# Patient Record
Sex: Male | Born: 1995 | Race: Asian | Hispanic: No | Marital: Single | Smoking: Never smoker
Health system: Southern US, Community
[De-identification: ages and names within clinical notes are randomized; demographics above are authoritative.]

## PROBLEM LIST (undated history)

## (undated) DIAGNOSIS — Z789 Other specified health status: Secondary | ICD-10-CM

## (undated) DIAGNOSIS — E785 Hyperlipidemia, unspecified: Secondary | ICD-10-CM

## (undated) DIAGNOSIS — G709 Myoneural disorder, unspecified: Secondary | ICD-10-CM

## (undated) HISTORY — PX: NO PAST SURGERIES: SHX2092

## (undated) HISTORY — DX: Other specified health status: Z78.9

## (undated) HISTORY — DX: Myoneural disorder, unspecified: G70.9

## (undated) HISTORY — DX: Hyperlipidemia, unspecified: E78.5

---
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
## 2013-06-10 ENCOUNTER — Ambulatory Visit (INDEPENDENT_AMBULATORY_CARE_PROVIDER_SITE_OTHER): Payer: BC Managed Care – PPO | Admitting: Emergency Medicine

## 2013-06-10 VITALS — BP 136/78 | HR 65 | Temp 98.5°F | Resp 18 | Wt 166.0 lb

## 2013-06-10 DIAGNOSIS — H6121 Impacted cerumen, right ear: Secondary | ICD-10-CM

## 2013-06-10 DIAGNOSIS — H6120 Impacted cerumen, unspecified ear: Secondary | ICD-10-CM

## 2013-06-10 NOTE — Progress Notes (Signed)
   Subjective:    Patient ID: Trevor Bates, male    DOB: 1996-04-25, 18 y.o.   MRN: [id number]  HPI patient presents with wax impaction in his right ear. He used to scoop without success and is here for irrigation.   Review of Systems     Objective:   Physical Exam There is wax occluding both external auditory canals appear       Assessment & Plan:   patient did undergo irrigation to remove the wax. Wax removed by irrigation .

## 2013-09-06 ENCOUNTER — Ambulatory Visit (INDEPENDENT_AMBULATORY_CARE_PROVIDER_SITE_OTHER): Payer: BC Managed Care – PPO | Admitting: Family Medicine

## 2013-09-06 ENCOUNTER — Encounter: Payer: Self-pay | Admitting: Family Medicine

## 2013-09-06 VITALS — BP 114/50 | HR 74 | Temp 98.4°F | Resp 16 | Ht 66.0 in | Wt 164.0 lb

## 2013-09-06 DIAGNOSIS — J309 Allergic rhinitis, unspecified: Secondary | ICD-10-CM

## 2013-09-06 DIAGNOSIS — J3489 Other specified disorders of nose and nasal sinuses: Secondary | ICD-10-CM

## 2013-09-06 DIAGNOSIS — R0981 Nasal congestion: Secondary | ICD-10-CM

## 2013-09-06 DIAGNOSIS — R0609 Other forms of dyspnea: Secondary | ICD-10-CM

## 2013-09-06 DIAGNOSIS — F411 Generalized anxiety disorder: Secondary | ICD-10-CM

## 2013-09-06 DIAGNOSIS — G4700 Insomnia, unspecified: Secondary | ICD-10-CM

## 2013-09-06 DIAGNOSIS — R0683 Snoring: Secondary | ICD-10-CM

## 2013-09-06 DIAGNOSIS — R0989 Other specified symptoms and signs involving the circulatory and respiratory systems: Secondary | ICD-10-CM

## 2013-09-06 MED ORDER — CLONAZEPAM 0.5 MG PO TABS: ORAL_TABLET | ORAL | Status: DC

## 2013-09-06 MED ORDER — TRIAMCINOLONE ACETONIDE 55 MCG/ACT NA AERO: 2.0000 | INHALATION_SPRAY | Freq: Every day | NASAL | Status: DC

## 2013-09-06 MED ORDER — ESCITALOPRAM OXALATE 20 MG PO TABS: 20.0000 mg | ORAL_TABLET | Freq: Every day | ORAL | Status: DC

## 2013-09-06 MED ORDER — DESLORATADINE 5 MG PO TABS: 5.0000 mg | ORAL_TABLET | Freq: Every day | ORAL | Status: DC

## 2013-09-06 NOTE — Patient Instructions (Addendum)
Generalized Anxiety Disorder Generalized anxiety disorder (GAD) is a mental disorder. It interferes with life functions, including relationships, work, and school. GAD is different from normal anxiety, which everyone experiences at some point in their lives in response to specific life events and activities. Normal anxiety actually helps [place] prepare for and get through these life events and activities. Normal anxiety goes away after the event or activity is over.  GAD causes anxiety that is not necessarily related to specific events or activities. It also causes excess anxiety in proportion to specific events or activities. The anxiety associated with GAD is also difficult to control. GAD can vary from mild to severe. People with severe GAD can have intense waves of anxiety with physical symptoms (panic attacks).  SYMPTOMS The anxiety and worry associated with GAD are difficult to control. This anxiety and worry are related to many life events and activities and also occur more days than not for 6 months or longer. People with GAD also have three or more of the following symptoms (one or more in children):  Restlessness.   Fatigue.  Difficulty concentrating.   Irritability.  Muscle tension.  Difficulty sleeping or unsatisfying sleep. DIAGNOSIS GAD is diagnosed through an assessment by your caregiver. Your caregiver will ask you questions aboutyour mood,physical symptoms, and events in your life. Your caregiver may ask you about your medical history and use of alcohol or drugs, including prescription medications. Your caregiver may also do a physical exam and blood tests. Certain medical conditions and the use of certain substances can cause symptoms similar to those associated with GAD. Your caregiver may refer you to a mental health specialist for further evaluation. TREATMENT The following therapies are usually used to treat GAD:   Medication Antidepressant medication usually is  prescribed for long-term daily control. Antianxiety medications may be added in severe cases, especially when panic attacks occur.   Talk therapy (psychotherapy) Certain types of talk therapy can be helpful in treating GAD by providing support, education, and guidance. A form of talk therapy called cognitive behavioral therapy can teach you healthy ways to think about and react to daily life events and activities.  Stress managementtechniques These include yoga, meditation, and exercise and can be very helpful when they are practiced regularly. A mental health specialist can help determine which treatment is best for you. Some people see improvement with one therapy. However, other people require a combination of therapies. Document Released: 09/10/2012 Document Reviewed: 09/10/2012 [employer] Patient Information    I have prescribed an anti-anxiety medication for you to take at bedtime. This medication should help you relax and enable you to sleep better.\ You also have to improve your nutrition; avoid processed foods and foods that have a lot of additives. I have given you some information about anti-inflammatory foods.  Taking a Omega-3 Fish Oil supplement can help with brain(and cardiovascular) health.    Allergic Rhinitis Allergic rhinitis is when the mucous membranes in the nose respond to allergens. Allergens are particles in the air that cause your body to have an allergic reaction. This causes you to release allergic antibodies. Through a chain of events, these eventually cause you to release histamine into the blood stream. Although meant to protect the body, it is this release of histamine that causes your discomfort, such as frequent sneezing, congestion, and an itchy, runny nose.  CAUSES  Seasonal allergic rhinitis (hay fever) is caused by pollen allergens that may come from grasses, trees, and weeds. Year-round allergic rhinitis (perennial allergic  rhinitis) is caused by allergens  such as house dust mites, pet dander, and mold spores.  SYMPTOMS   Nasal stuffiness (congestion).  Itchy, runny nose with sneezing and tearing of the eyes. DIAGNOSIS  Your health care provider can help you determine the allergen or allergens that trigger your symptoms. If you and your health care provider are unable to determine the allergen, skin or blood testing may be used. TREATMENT  Allergic Rhinitis does not have a cure, but it can be controlled by:  Medicines and allergy shots (immunotherapy).  Avoiding the allergen. Hay fever may often be treated with antihistamines in pill or nasal spray forms. Antihistamines block the effects of histamine. There are over-the-counter medicines that may help with nasal congestion and swelling around the eyes. Check with your health care provider before taking or giving this medicine.  If avoiding the allergen or the medicine prescribed do not work, there are many new medicines your health care provider can prescribe. Stronger medicine may be used if initial measures are ineffective. Desensitizing injections can be used if medicine and avoidance does not work. Desensitization is when a patient is given ongoing shots until the body becomes less sensitive to the allergen. Make sure you follow up with your health care provider if problems continue. HOME CARE INSTRUCTIONS It is not possible to completely avoid allergens, but you can reduce your symptoms by taking steps to limit your exposure to them. It helps to know exactly what you are allergic to so that you can avoid your specific triggers. SEEK MEDICAL CARE IF:   You have a fever.  You develop a cough that does not stop easily (persistent).  You have shortness of breath.  You start wheezing.  Symptoms interfere with normal daily activities. Document Released: 02/08/2001 Document Revised: 03/06/2013 Document Reviewed: 01/21/2013 [facility] Patient Information 2014 [place], [place].  Use the nasal  spray every night. Take the allergy medication to help reduce nasal congestion. You need to wear a mask when you are mowing the lawn or doing any work outside where you are exposed to lots of pollen. I may need to refer you to an Ear, Nose and Throat specialist for full evaluation if the medications are not eefective.  Get AYR over-the-counter saline nasal mist to help relieve nasal congestion. You can use this product during the day.

## 2013-09-06 NOTE — Progress Notes (Signed)
Subjective:    Patient ID: Trevor Bates, male    DOB: [birth year], 18 y.o.   MRN: [id number]  HPI  This 18 y.o. Asian male presents for evaluation and treatment of insomnia, onset > 1 year ago. Pt graduated high school in spring 2014 and attended fall semester at [employer]. He had some difficulty adjusting and had worsening sleep disturbance. He sought evaluation and was advised that he might be "Bipolar" and that he needed to seek further treatment. He is now enrolled [facility] [employer]. He is considering Biology major but not certain what he would like to do upon graduation.  Pt is able to perform academically, having taken AP courses in his senior year of high school. GPA=3.0; pt reports that he found he could do the work w/o really studying. He did not feel really challenged in high school. He reports often daydreaming. He does not sleep well, often laying awake until 2-3 AM. He admits trying alcohol and marijuana; the latter helps him relax so that he can sleep. He states his mind "will not shut down" and he functions on < 5 hours of sleep. He acknowledges that his nutrition if fair; he drinks coffee but no other significant sources of caffeine. Routine exercise (used to play tennis) but not excessive.  Pt states that he snores loudly; he cannot breath through his nose since childhood. This is a chronic problem; never evaluated by ENT. Known to have allergies. Not using any medication to reduce symptoms.  Family Hx is significant for anxiety but no significant mental illnesses.   Review of Systems  Constitutional: Negative.   HENT: Positive for congestion, rhinorrhea and sinus pressure. Negative for ear pain, facial swelling, postnasal drip, sneezing, sore throat and voice change.   Eyes: Negative.   Respiratory: Negative for cough, chest tightness, shortness of breath and wheezing.        Snores.  Cardiovascular: Negative.   Endocrine: Negative.   Genitourinary: Negative.   Musculoskeletal:  Negative.   Skin: Negative.   Allergic/Immunologic: Positive for environmental allergies.  Neurological: Negative.   Hematological: Negative.   Psychiatric/Behavioral: Positive for sleep disturbance and decreased concentration. Negative for hallucinations, behavioral problems, confusion, self-injury and agitation. The patient is nervous/anxious. The patient is not hyperactive.        Objective:   Physical Exam  Nursing note and vitals reviewed. Constitutional: He is oriented to person, place, and time. Vital signs are normal. He appears well-developed and well-nourished. No distress.  HENT:  Head: Normocephalic and atraumatic.  Right Ear: Hearing, tympanic membrane and external ear normal.  Left Ear: Hearing, tympanic membrane and external ear normal.  Nose: Mucosal edema present. No rhinorrhea, sinus tenderness, nasal deformity or septal deviation. No epistaxis. Right sinus exhibits no maxillary sinus tenderness and no frontal sinus tenderness. Left sinus exhibits no maxillary sinus tenderness and no frontal sinus tenderness.  Mouth/Throat: Uvula is midline and mucous membranes are normal. No oral lesions. Normal dentition. No uvula swelling or dental caries. Oropharyngeal exudate and posterior oropharyngeal erythema present.  Excessive flaky debris in canals. Tonsils 2+.  Eyes: EOM and lids are normal. Pupils are equal, round, and reactive to light. Right conjunctiva is injected. Left conjunctiva is injected. No scleral icterus.  Neck: Trachea normal, normal range of motion and full passive range of motion without pain. Neck supple. No spinous process tenderness and no muscular tenderness present. No mass and no thyromegaly present.  Cardiovascular: Normal rate, regular rhythm, S1 normal, S2 normal and normal heart  sounds.   No extrasystoles are present. PMI is not displaced.  Exam reveals no gallop and no friction rub.   No murmur heard. Pulmonary/Chest: Effort normal and breath sounds  normal. No respiratory distress.  Abdominal: Soft. Normal appearance and bowel sounds are normal. He exhibits no mass. There is no hepatosplenomegaly. There is no tenderness. There is no guarding and no CVA tenderness.  Musculoskeletal: Normal range of motion. He exhibits tenderness. He exhibits no edema.  Neurological: He is alert and oriented to person, place, and time. He has normal strength. He displays no atrophy and no tremor. No cranial nerve deficit or sensory deficit. He exhibits normal muscle tone. He displays a negative Romberg sign. Coordination and gait normal.  Reflex Scores:      Tricep reflexes are 1+ on the right side and 1+ on the left side.      Bicep reflexes are 1+ on the right side and 1+ on the left side.      Brachioradialis reflexes are 1+ on the right side and 1+ on the left side.      Patellar reflexes are 2+ on the right side and 2+ on the left side. Skin: Skin is warm, dry and intact. No bruising and no lesion noted. No cyanosis or erythema. No pallor. Nails show no clubbing.  Psychiatric: His speech is normal and behavior is normal. Judgment and thought content normal. His mood appears anxious. His affect is not angry, not labile and not inappropriate. Cognition and memory are normal. He does not exhibit a depressed mood.      Assessment & Plan:  Anxiety state, unspecified - Trial Clonazepam at bedtime. Will have pt complete mood disorder slef-questionnaire at follow-up. Plan: Thyroid Panel With TSH, Comprehensive metabolic panel  Insomnia - Plan: Vitamin D, 25-hydroxy, Thyroid Panel With TSH  Snores- Consider ENT evaluation if treatment of allergies does not reduce symptom.  Chronic nasal congestion  Allergic rhinitis - Plan: CBC with Differential  Meds ordered this encounter  Medications  . DISCONTD: triamcinolone (NASACORT AQ) 55 MCG/ACT AERO nasal inhaler    Sig: Place 2 sprays into the nose daily.    Dispense:  1 Inhaler    Refill:  12  .  triamcinolone (NASACORT AQ) 55 MCG/ACT AERO nasal inhaler    Sig: Place 2 sprays into the nose daily. Use at bedtime.    Dispense:  1 Inhaler    Refill:  12  . DISCONTD: escitalopram (LEXAPRO) 20 MG tablet    Sig: Take 1 tablet (20 mg total) by mouth daily.    Dispense:  30 tablet    Refill:  3  . clonazePAM (KLONOPIN) 0.5 MG tablet    Sig: Take 1 or 2 tablets at bedtime for anxiety and for sleep.    Dispense:  30 tablet    Refill:  1  . desloratadine (CLARINEX) 5 MG tablet    Sig: Take 1 tablet (5 mg total) by mouth daily.    Dispense:  30 tablet    Refill:  5

## 2013-09-07 LAB — COMPREHENSIVE METABOLIC PANEL
ALBUMIN: 4.5 g/dL (ref 3.5–5.2)
ALT: 31 U/L (ref 0–53)
AST: 62 U/L — ABNORMAL HIGH (ref 0–37)
Alkaline Phosphatase: 60 U/L (ref 39–117)
BUN: 16 mg/dL (ref 6–23)
CO2: 26 meq/L (ref 19–32)
CREATININE: 1.05 mg/dL (ref 0.50–1.35)
Calcium: 9.9 mg/dL (ref 8.4–10.5)
Chloride: 100 mEq/L (ref 96–112)
GLUCOSE: 83 mg/dL (ref 70–99)
POTASSIUM: 4.3 meq/L (ref 3.5–5.3)
Sodium: 137 mEq/L (ref 135–145)
Total Bilirubin: 0.6 mg/dL (ref 0.2–1.1)
Total Protein: 7 g/dL (ref 6.0–8.3)

## 2013-09-07 LAB — CBC WITH DIFFERENTIAL/PLATELET
BASOS ABS: 0 10*3/uL (ref 0.0–0.1)
BASOS PCT: 0 % (ref 0–1)
EOS ABS: 0.1 10*3/uL (ref 0.0–0.7)
EOS PCT: 1 % (ref 0–5)
HCT: 44.1 % (ref 39.0–52.0)
Hemoglobin: 15.1 g/dL (ref 13.0–17.0)
LYMPHS ABS: 2.2 10*3/uL (ref 0.7–4.0)
Lymphocytes Relative: 19 % (ref 12–46)
MCH: 29.6 pg (ref 26.0–34.0)
MCHC: 34.2 g/dL (ref 30.0–36.0)
MCV: 86.5 fL (ref 78.0–100.0)
Monocytes Absolute: 0.8 10*3/uL (ref 0.1–1.0)
Monocytes Relative: 7 % (ref 3–12)
NEUTROS PCT: 73 % (ref 43–77)
Neutro Abs: 8.5 10*3/uL — ABNORMAL HIGH (ref 1.7–7.7)
PLATELETS: 291 10*3/uL (ref 150–400)
RBC: 5.1 MIL/uL (ref 4.22–5.81)
RDW: 13.2 % (ref 11.5–15.5)
WBC: 11.6 10*3/uL — ABNORMAL HIGH (ref 4.0–10.5)

## 2013-09-07 LAB — VITAMIN D 25 HYDROXY (VIT D DEFICIENCY, FRACTURES): VIT D 25 HYDROXY: 13 ng/mL — AB (ref 30–89)

## 2013-09-07 LAB — THYROID PANEL WITH TSH
FREE THYROXINE INDEX: 3.9 (ref 1.0–3.9)
T3 Uptake: 36.1 % (ref 22.5–37.0)
T4, Total: 10.7 ug/dL (ref 5.0–12.5)
TSH: 0.911 u[IU]/mL (ref 0.350–4.500)

## 2013-09-08 ENCOUNTER — Other Ambulatory Visit: Payer: Self-pay | Admitting: Family Medicine

## 2013-09-08 DIAGNOSIS — J309 Allergic rhinitis, unspecified: Secondary | ICD-10-CM | POA: Insufficient documentation

## 2013-09-08 DIAGNOSIS — G4700 Insomnia, unspecified: Secondary | ICD-10-CM | POA: Insufficient documentation

## 2013-09-08 DIAGNOSIS — R0683 Snoring: Secondary | ICD-10-CM | POA: Insufficient documentation

## 2013-09-08 DIAGNOSIS — F411 Generalized anxiety disorder: Secondary | ICD-10-CM | POA: Insufficient documentation

## 2013-09-08 MED ORDER — ERGOCALCIFEROL 1.25 MG (50000 UT) PO CAPS: 50000.0000 [IU] | ORAL_CAPSULE | ORAL | Status: DC

## 2013-09-08 NOTE — Progress Notes (Signed)
Quick Note:  Please advise pt regarding following labs... All labs look good except Vitamin D level is very low. I am prescribing a once-a-week supplement (Vitamin D 1610950000 units) to be taken for several months. Also try to increase intake of Vitamin-D rich foods: salmon, tuna, sardines, mushrooms, eggs and some dairy products.  Of course, include lots of fruits and vegetables, healthy nuts and other snacks for a well-balanced diet.  All results will be reviewed at next appointment.  Copy to pt. ______

## 2013-10-15 ENCOUNTER — Ambulatory Visit: Payer: BC Managed Care – PPO | Admitting: Family Medicine

## 2013-10-18 ENCOUNTER — Ambulatory Visit (INDEPENDENT_AMBULATORY_CARE_PROVIDER_SITE_OTHER): Payer: BC Managed Care – PPO | Admitting: Family Medicine

## 2013-10-18 ENCOUNTER — Encounter: Payer: Self-pay | Admitting: Family Medicine

## 2013-10-18 VITALS — BP 128/68 | HR 68 | Temp 98.6°F | Resp 16 | Ht 65.5 in | Wt 168.2 lb

## 2013-10-18 DIAGNOSIS — E559 Vitamin D deficiency, unspecified: Secondary | ICD-10-CM

## 2013-10-18 DIAGNOSIS — F411 Generalized anxiety disorder: Secondary | ICD-10-CM

## 2013-10-18 MED ORDER — DESLORATADINE 5 MG PO TABS: 5.0000 mg | ORAL_TABLET | Freq: Every day | ORAL | Status: DC

## 2013-10-18 MED ORDER — CLONAZEPAM 1 MG PO TABS: ORAL_TABLET | ORAL | Status: DC

## 2013-10-18 NOTE — Patient Instructions (Signed)
Vitamin D Deficiency  Vitamin D is an important vitamin that your body needs. Having too little of it in your body is called a deficiency. A very bad deficiency can make your bones soft and can cause a condition called rickets.   Vitamin D is important to your body for different reasons, such as:   · It helps your body absorb 2 minerals called calcium and phosphorus.  · It helps make your bones healthy.  · It may prevent some diseases, such as diabetes and multiple sclerosis.  · It helps your muscles and heart.  You can get vitamin D in several ways. It is a natural part of some foods. The vitamin is also added to some dairy products and cereals. Some people take vitamin D supplements. Also, your body makes vitamin D when you are in the sun. It changes the sun's rays into a form of the vitamin that your body can use.  CAUSES   · Not eating enough foods that contain vitamin D.  · Not getting enough sunlight.  · Having certain digestive system diseases that make it hard to absorb vitamin D. These diseases include Crohn's disease, chronic pancreatitis, and cystic fibrosis.  · Having a surgery in which part of the stomach or small intestine is removed.  · Being obese. Fat cells pull vitamin D out of your blood. That means that obese people may not have enough vitamin D left in their blood and in other body tissues.  · Having chronic kidney or liver disease.  RISK FACTORS  Risk factors are things that make you more likely to develop a vitamin D deficiency. They include:  · Being older.  · Not being able to get outside very much.  · Living in a nursing home.  · Having had broken bones.  · Having weak or thin bones (osteoporosis).  · Having a disease or condition that changes how your body absorbs vitamin D.  · Having dark skin.  · Some medicines such as seizure medicines or steroids.  · Being overweight or obese.  SYMPTOMS  Mild cases of vitamin D deficiency may not have any symptoms. If you have a very bad case, symptoms  may include:  · Bone pain.  · Muscle pain.  · Falling often.  · Broken bones caused by a minor injury, due to osteoporosis.  DIAGNOSIS  A blood test is the best way to tell if you have a vitamin D deficiency.  TREATMENT  Vitamin D deficiency can be treated in different ways. Treatment for vitamin D deficiency depends on what is causing it. Options include:  · Taking vitamin D supplements.  · Taking a calcium supplement. Your caregiver will suggest what dose is best for you.  HOME CARE INSTRUCTIONS  · Take any supplements that your caregiver prescribes. Follow the directions carefully. Take only the suggested amount.  · Have your blood tested 2 months after you start taking supplements.  · Eat foods that contain vitamin D. Healthy choices include:  · Fortified dairy products, cereals, or juices. Fortified means vitamin D has been added to the food. Check the label on the package to be sure.  · Fatty fish like salmon or trout.  · Eggs.  · Oysters.  · Do not use a tanning bed.  · Keep your weight at a healthy level. Lose weight if you need to.  · Keep all follow-up appointments. Your caregiver will need to perform blood tests to make sure your vitamin D deficiency   is going away.  SEEK MEDICAL CARE IF:  · You have any questions about your treatment.  · You continue to have symptoms of vitamin D deficiency.  · You have nausea or vomiting.  · You are constipated.  · You feel confused.  · You have severe abdominal or back pain.  MAKE SURE YOU:  · Understand these instructions.  · Will watch your condition.  · Will get help right away if you are not doing well or get worse.  Document Released: 08/08/2011 Document Revised: 09/10/2012 Document Reviewed: 08/08/2011  Exit[employer]® Patient Information ©2014 [employer].

## 2013-10-20 DIAGNOSIS — E559 Vitamin D deficiency, unspecified: Secondary | ICD-10-CM | POA: Insufficient documentation

## 2013-10-20 NOTE — Progress Notes (Signed)
S:  This 18 y.o. Asian male returns for follow-up re: anxiety. Clonazepam is effectively reducing anxiety; he is able to focus and has less anxiety about assignments. He is eating better and has a good fitness routine.  He has been taking twice the prescribed dose of Clonazepam. He did not get the allergy medication prescription; congestion and sinus pressure as well as PND continue.   Pt has Vit D def and supplement (50000 units per cap) prescribed. He took it for a month, not realizing he has refills. He did get an OTC Vit D3 2000 units supplement and has been taking that some days.  Patient Active Problem List   Diagnosis Date Noted  . Allergic rhinitis 09/08/2013  . Insomnia 09/08/2013  . Anxiety state, unspecified 09/08/2013  . Snores 09/08/2013  . Sports physical 07/13/2011  . Elevated blood pressure 07/13/2011   PMHx, Soc Hx and MEDICATIONS reviewed.  ROS: As per HPI; otherwise noncontributory.  O: Filed Vitals:   10/18/13 1506  BP: 128/68  Pulse: 68  Temp: 98.6 F (37 C)  Resp: 16   GEN: In NAD; WN,WD. HENT: [place]/AT; EOMI w/ clear conj/sclerae. Nasal tonality in voice. COR: RRR. LUNGS: Unlabored resp. SKIN: W&D; intact w/o diaphoresis, erythema or pallor. NEURO: A&O x 3; CNs intact. Nonfocal.  A/P: Anxiety state, unspecified- RX: Clonazepam 1 mg tablet twice a day prn. If this medication dose is not sufficient, will need to consider daily anxiolytic (i.e. SSRI).  Unspecified vitamin D deficiency- Continue taking prescription supplement until no refills remain. Take OTC Vit D3 supplement from that time forward. Pt understands.  Meds ordered this encounter  Medications  . desloratadine (CLARINEX) 5 MG tablet    Sig: Take 1 tablet (5 mg total) by mouth daily.    Dispense:  30 tablet    Refill:  5  . clonazePAM (KLONOPIN) 1 MG tablet    Sig: Take 1 tablet bid prn anxiety or at bedtime for sleep.    Dispense:  40 tablet    Refill:  1

## 2014-02-14 ENCOUNTER — Ambulatory Visit (INDEPENDENT_AMBULATORY_CARE_PROVIDER_SITE_OTHER): Payer: BC Managed Care – PPO | Admitting: Family Medicine

## 2014-02-14 ENCOUNTER — Encounter: Payer: Self-pay | Admitting: Family Medicine

## 2014-02-14 VITALS — BP 130/74 | HR 62 | Temp 97.5°F | Resp 16 | Ht 66.5 in | Wt 159.0 lb

## 2014-02-14 DIAGNOSIS — E559 Vitamin D deficiency, unspecified: Secondary | ICD-10-CM

## 2014-02-14 DIAGNOSIS — J309 Allergic rhinitis, unspecified: Secondary | ICD-10-CM

## 2014-02-14 MED ORDER — CLONAZEPAM 1 MG PO TABS: ORAL_TABLET | ORAL | Status: DC

## 2014-02-14 NOTE — Patient Instructions (Signed)
You need to get a multivitamin for young men that has extra Vitamin D3 in it; take this daily and try to improve your nutrition.       Mediterranean Diet  Why follow it? Research shows.   Those who follow the Mediterranean diet have a reduced risk of heart disease    The diet is associated with a reduced incidence of Parkinson's and Alzheimer's diseases   People following the diet may have longer life expectancies and lower rates of chronic diseases    The Dietary Guidelines for Americans recommends the Mediterranean diet as an eating plan to promote health and prevent disease  What Is the Mediterranean Diet?    Healthy eating plan based on typical foods and recipes of Mediterranean-style cooking   The diet is primarily a plant based diet; these foods should make up a majority of meals   Starches - Plant based foods should make up a majority of meals - They are an important sources of vitamins, minerals, energy, antioxidants, and fiber - Choose whole grains, foods high in fiber and minimally processed items  - Typical grain sources include wheat, oats, barley, corn, brown rice, bulgar, farro, millet, polenta, couscous  - Various types of beans include chickpeas, lentils, fava beans, black beans, white beans   Fruits  Veggies - Large quantities of antioxidant rich fruits & veggies; 6 or more servings  - Vegetables can be eaten raw or lightly drizzled with oil and cooked  - Vegetables common to the traditional Mediterranean Diet include: artichokes, arugula, beets, broccoli, brussel sprouts, cabbage, carrots, celery, collard greens, cucumbers, eggplant, kale, leeks, lemons, lettuce, mushrooms, okra, onions, peas, peppers, potatoes, pumpkin, radishes, rutabaga, shallots, spinach, sweet potatoes, turnips, zucchini - Fruits common to the Mediterranean Diet include: apples, apricots, avocados, cherries, clementines, dates, figs, grapefruits, grapes, melons, nectarines, oranges, peaches, pears,  pomegranates, strawberries, tangerines  Fats - Replace butter and margarine with healthy oils, such as olive oil, canola oil, and tahini  - Limit nuts to no more than a handful a day  - Nuts include walnuts, almonds, pecans, pistachios, pine nuts  - Limit or avoid candied, honey roasted or heavily salted nuts - Olives are central to the [employer] - can be eaten whole or used in a variety of dishes   Meats Protein - Limiting red meat: no more than a few times a month - When eating red meat: choose lean cuts and keep the portion to the size of deck of cards - Eggs: approx. 0 to 4 times a week  - Fish and lean poultry: at least 2 a week  - Healthy protein sources include, chicken, [place], lean beef, lamb - Increase intake of seafood such as tuna, salmon, trout, mackerel, shrimp, scallops - Avoid or limit high fat processed meats such as sausage and bacon  Dairy - Include moderate amounts of low fat dairy products  - Focus on healthy dairy such as fat free yogurt, skim milk, low or reduced fat cheese - Limit dairy products higher in fat such as whole or 2% milk, cheese, ice cream  Alcohol - Moderate amounts of red wine is ok  - No more than 5 oz daily for women (all ages) and men older than age 6  - No more than 10 oz of wine daily for men younger than 23  Other - Limit sweets and other desserts  - Use herbs and spices instead of salt to flavor foods  - Herbs and spices common to the  traditional Mediterranean Diet include: basil, bay leaves, chives, cloves, cumin, fennel, garlic, lavender, marjoram, mint, oregano, parsley, pepper, rosemary, sage, savory, sumac, tarragon, thyme   It's not just a diet, it's a lifestyle:    The Mediterranean diet includes lifestyle factors typical of those in the region    Foods, drinks and meals are best eaten with others and savored   Daily physical activity is important for overall good health   This could be strenuous exercise like running and  aerobics   This could also be more leisurely activities such as walking, housework, yard-work, or taking the stairs   Moderation is the key; a balanced and healthy diet accommodates most foods and drinks   Consider portion sizes and frequency of consumption of certain foods   Meal Ideas & Options:    Breakfast:  o Whole wheat toast or whole wheat English muffins with peanut butter & hard boiled egg o Steel cut oats topped with apples & cinnamon and skim milk  o Fresh fruit: banana, strawberries, melon, berries, peaches  o Smoothies: strawberries, bananas, greek yogurt, peanut butter o Low fat greek yogurt with blueberries and granola  o Egg white omelet with spinach and mushrooms o Breakfast couscous: whole wheat couscous, apricots, skim milk, cranberries    Sandwiches:  o Hummus and grilled vegetables (peppers, zucchini, squash) on whole wheat bread   o Grilled chicken on whole wheat pita with lettuce, tomatoes, cucumbers or tzatziki  o Tuna salad on whole wheat bread: tuna salad made with greek yogurt, olives, red peppers, capers, green onions o Garlic rosemary lamb pita: lamb sauted with garlic, rosemary, salt & pepper; add lettuce, cucumber, greek yogurt to pita - flavor with lemon juice and black pepper    Seafood:  o Mediterranean grilled salmon, seasoned with garlic, basil, parsley, lemon juice and black pepper o Shrimp, lemon, and spinach whole-grain pasta salad made with low fat greek yogurt  o Seared scallops with lemon orzo  o Seared tuna steaks seasoned salt, pepper, coriander topped with tomato mixture of olives, tomatoes, olive oil, minced garlic, parsley, green onions and cappers    Meats:  o Herbed greek chicken salad with kalamata olives, cucumber, feta  o Red bell peppers stuffed with spinach, bulgur, lean ground beef (or lentils) & topped with feta   o Kebabs: skewers of chicken, tomatoes, onions, zucchini, squash  o [place] burgers: made with red onions, mint, dill,  lemon juice, feta cheese topped with roasted red peppers   Vegetarian o Cucumber salad: cucumbers, artichoke hearts, celery, red onion, feta cheese, tossed in olive oil & lemon juice  o Hummus and whole grain pita points with a greek salad (lettuce, tomato, feta, olives, cucumbers, red onion) o Lentil soup with celery, carrots made with vegetable broth, garlic, salt and pepper  Tabouli salad: parsley, bulgur, mint, scallions, cucumbers, tomato, radishes, lemon juice, olive oil, salt and pepper.

## 2014-02-15 LAB — VITAMIN D 25 HYDROXY (VIT D DEFICIENCY, FRACTURES): Vit D, 25-Hydroxy: 27 ng/mL — ABNORMAL LOW (ref 30–89)

## 2014-02-15 NOTE — Progress Notes (Signed)
S:  This 18 y.o. Asian male returns for follow-up re: anxiety and medication use. He reports he is using Clonazepam as needed for anxiety. 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 He is working less so that he can devote more time to his studies. He admits that nutrition needs improvement. He stopped taking the Vit D supplement because his mother did not think he needed it. He sleeps well and denies fatigue, palpitations, GI problems, agitation, HA, dizziness, weakness or paresthesias.  He has significant allergies but is not taking prescribed antihistamine or using nasal spray as directed. Congestion and rhinorrhea persists.  Patient Active Problem List   Diagnosis Date Noted  . Unspecified vitamin D deficiency 10/20/2013  . Allergic rhinitis 09/08/2013  . Insomnia 09/08/2013  . Anxiety state, unspecified 09/08/2013  . Snores 09/08/2013  . Sports physical 07/13/2011  . Elevated blood pressure 07/13/2011    Prior to Admission medications   Medication Sig Start Date End Date Taking? Authorizing Provider  clonazePAM (KLONOPIN) 1 MG tablet Take 1 tablet bid prn anxiety or at bedtime for sleep. 02/14/14  Yes Maurice March, MD  desloratadine (CLARINEX) 5 MG tablet Take 1 tablet (5 mg total) by mouth daily. 10/18/13  Yes Maurice March, MD  triamcinolone (NASACORT AQ) 55 MCG/ACT AERO nasal inhaler Place 2 sprays into the nose daily. Use at bedtime. 09/06/13   Maurice March, MD    History   Social History  . Marital Status: Single    Spouse Name: N/A    Number of Children: N/A  . Years of Education: N/A   Occupational History  . Not on file.   Social History Main Topics  . Smoking status: Never Smoker   . Smokeless tobacco: Not on file  . Alcohol Use: No  . Drug Use: Yes    Special: Marijuana     Comment: occasional use   . Sexual Activity: Not on file   Other Topics Concern  . Not on file   Social History Narrative  . No narrative on file     ROS: As per HPI.   O: Filed Vitals:   02/14/14 1553  BP: 130/74  Pulse: 62  Temp: 97.5 F (36.4 C)  Resp: 16    GEN: In NAD; WN,WD. HENT: [place]/AT; EOMI w/clear conj and sclerae. Otherwise unremarkable. Voice quality- nasal tonality. COR: RRR. LUNGS: Unlabored resp. SKIN: W&D; intact w/o erythema or pallor. NEURO: A&O x 3; CNs intact. Nonfocal.  A/P: Unspecified vitamin D deficiency - Encourage MVI w/ Vit D if he is not going to complete prescribed Vit D. Encouraged improved nutrition.  Plan: Vitamin D, 25-hydroxy  Allergic rhinitis, unspecified allergic rhinitis type- Encouraged use of medications as prescribed.

## 2014-02-16 NOTE — Progress Notes (Signed)
Quick Note:  Please advise pt regarding following labs...  Vitamin D level has improved but is still low. Get the over-the-counter supplement- Vitamin D3 1000 units or a multivitamin with D3 in it- as we discussed and take it daily.  Eat more foods that contain Vitamin D (salmon, tuna, mushrooms, eggs, some dairy products) and continue getting regular daily sun exposure.  Copy to pt.  ______

## 2014-02-26 ENCOUNTER — Encounter: Payer: Self-pay | Admitting: Radiology

## 2014-07-08 ENCOUNTER — Ambulatory Visit: Payer: Self-pay | Admitting: Family Medicine

## 2014-07-11 ENCOUNTER — Encounter: Payer: Self-pay | Admitting: Family Medicine

## 2014-07-11 ENCOUNTER — Ambulatory Visit (INDEPENDENT_AMBULATORY_CARE_PROVIDER_SITE_OTHER): Payer: BLUE CROSS/BLUE SHIELD | Admitting: Family Medicine

## 2014-07-11 VITALS — BP 145/77 | HR 66 | Temp 98.2°F | Resp 16 | Ht 67.0 in | Wt 165.0 lb

## 2014-07-11 DIAGNOSIS — E559 Vitamin D deficiency, unspecified: Secondary | ICD-10-CM

## 2014-07-11 DIAGNOSIS — F411 Generalized anxiety disorder: Secondary | ICD-10-CM

## 2014-07-11 DIAGNOSIS — J302 Other seasonal allergic rhinitis: Secondary | ICD-10-CM

## 2014-07-11 DIAGNOSIS — F5102 Adjustment insomnia: Secondary | ICD-10-CM

## 2014-07-11 MED ORDER — CITALOPRAM HYDROBROMIDE 20 MG PO TABS: 20.0000 mg | ORAL_TABLET | Freq: Every day | ORAL | Status: DC

## 2014-07-11 MED ORDER — DESLORATADINE 5 MG PO TABS: 5.0000 mg | ORAL_TABLET | Freq: Every day | ORAL | Status: DC

## 2014-07-11 MED ORDER — CLONAZEPAM 1 MG PO TABS: ORAL_TABLET | ORAL | Status: DC

## 2014-07-11 NOTE — Patient Instructions (Signed)
I have prescribed Citalopram 20 mg for Anxiety. Take 1/2 tablet every morning for the remainder of February then increase to 1 tablet on July 29, 2014. You still need to take clonazepam at night to help you sleep.  It will take the Citalopram 2-3 weeks to take effect so be patient. Continue to exercise and practice stress reduction.

## 2014-07-14 ENCOUNTER — Encounter: Payer: Self-pay | Admitting: Family Medicine

## 2014-07-14 NOTE — Progress Notes (Signed)
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 He was first evaluated by me in April 2015 when he recalled difficulty w/ transition into college life and recent transfer from [employer] to [employer]. 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  He has struggled w/ anxiety and sleep disturbance for > 2 years. Clonazepam helped relieve some anxiety but pt has run otu of this medication. Playing sports helped reduce symptoms but pt has not been very physically active during the winter. He has an older brother who [employer] and has a full time job; pt does not spend much time w/ him though they talk often. No close friends/confidantes. Did not go home for The Holidays but plans to go this spring. Today pt reports that he has not slept well in several days since school has resumed after the winter break. His anxiety has increased again. He endorses palpitations and mild fatigue.  Pt has severe chronic allergic rhinitis w/ nasal congestion. He snores due to difficulty w/ nasal congestion, resulting in mouth-breathing. He is not using allergy medication but occasionally uses a nasal spray.  Patient Active Problem List   Diagnosis Date Noted  . Unspecified vitamin D deficiency 10/20/2013  . Allergic rhinitis 09/08/2013  . Insomnia 09/08/2013  . Anxiety state 09/08/2013  . Elevated blood pressure 07/13/2011    Prior to Admission medications   Medication Sig Start Date End Date Taking? Authorizing Provider  clonazePAM (KLONOPIN) 1 MG tablet Take 1 tablet bid prn anxiety or at bedtime for sleep.   Yes Maurice MarchBarbara B Dennard Vezina, MD  desloratadine (CLARINEX) 5 MG tablet Take 1 tablet (5 mg total) by mouth daily.   No Maurice MarchBarbara B Dewaine Morocho, MD  triamcinolone (NASACORT AQ) 55 MCG/ACT AERO nasal inhaler Place 2 sprays into the nose daily. Use at bedtime. Patient not taking: Reported on 07/11/2014 09/06/13   Maurice MarchBarbara B Yashica Sterbenz, MD    History reviewed.  No pertinent past surgical history.  History   Social History  . Marital Status: Single    Spouse Name: N/A  . Number of Children: N/A  . Years of Education: N/A   Occupational History  . ArchivistCollege student at [employer]   Social History Main Topics  . Smoking status: Never Smoker   . Smokeless tobacco: Not on file  . Alcohol Use: No  . Drug Use: Yes    Special: Marijuana     Comment: occasional use   . Sexual Activity: Not on file   Other Topics Concern  . Not on file   Social History Narrative   Family History  Problem Relation Age of Onset  . Heart attack Maternal Grandmother   . Sudden death Neg Hx   . Hyperlipidemia Mother   . Thyroid disease Maternal Aunt    ROS: As per HPI; negative for anorexia, diaphoresis, sore throat, epistaxis, ear or sinus pain, cough, vision disturbances, CP or tightness, SOB or DOE, GI upset, HA, dizziness, weakness, numbness, tremor or syncope. He does not have confusion, hallucinations, dysphoric mood,  thoughts of self-harm, SI.HI.  O: Filed Vitals:   07/11/14 1619  BP: 145/77  Pulse: 66  Temp: 98.2 F (36.8 C)  Resp: 16    GEN: In NAD; WN,WD. Appears tired. HENT: [place]/AT; EOMI w/ injected conj and clear sclerae. Nasal mucosa edematous and boggy. Post ph erythematous w/o exudate or lesions. NECK: Supple w/o LAN or TMG. COR: RRR. Normal S1 and S2. No m/g/r.  LUNGS: CTA; no wheezes or rhonchi. SKIN: W&D; intact with facial acne. No jaundice, rashes or pallor. MS: MAEs; no c/c/e. NEURO: A&O x 3; CNs intact. Nonfocal. PSYCH: Pleasant and calm, attentive but w/ poor eye contact. Speech pattern and thought content normal. Judgement sound.  A/P: Anxiety state- Continue clonazepam prn; pt agrees to taking a daily medication for anxiety disorder. Start Citalopram 20 mg 1/2 tablet every morning for remainder of Feb then increase to 1 tablet every morning on March 1st as maintenance dose.  Insomnia due to stress- Continue anxiolytic while SSRI  treatment initiated. Encouraged good nutrition and regular fitness activity to promote better sleep.  Other seasonal allergic rhinitis- Use saline nasal mist or steroid spray daily; resume antihistamine now (pt thought he only needs to take this med during allergy season). I suspect he has chronic perennial allergic rhinitis.  Vitamin D deficiency- Check level; suggest MVI daily pending lab result.  Meds ordered this encounter  Medications  . citalopram (CELEXA) 20 MG tablet    Sig: Take 1 tablet (20 mg total) by mouth daily.    Dispense:  30 tablet    Refill:  3  . desloratadine (CLARINEX) 5 MG tablet    Sig: Take 1 tablet (5 mg total) by mouth daily.    Dispense:  30 tablet    Refill:  5  . clonazePAM (KLONOPIN) 1 MG tablet    Sig: Take 1 tablet bid prn anxiety or at bedtime for sleep.    Dispense:  40 tablet    Refill:  1

## 2014-07-16 ENCOUNTER — Ambulatory Visit: Payer: Self-pay | Admitting: Family Medicine

## 2014-09-09 ENCOUNTER — Ambulatory Visit (INDEPENDENT_AMBULATORY_CARE_PROVIDER_SITE_OTHER): Payer: BLUE CROSS/BLUE SHIELD | Admitting: Physician Assistant

## 2014-09-09 ENCOUNTER — Encounter: Payer: Self-pay | Admitting: Physician Assistant

## 2014-09-09 VITALS — BP 149/69 | HR 55 | Temp 99.2°F | Resp 16 | Ht 66.0 in | Wt 169.4 lb

## 2014-09-09 DIAGNOSIS — IMO0001 Reserved for inherently not codable concepts without codable children: Secondary | ICD-10-CM

## 2014-09-09 DIAGNOSIS — K219 Gastro-esophageal reflux disease without esophagitis: Secondary | ICD-10-CM

## 2014-09-09 DIAGNOSIS — M25562 Pain in left knee: Secondary | ICD-10-CM | POA: Diagnosis not present

## 2014-09-09 DIAGNOSIS — R030 Elevated blood-pressure reading, without diagnosis of hypertension: Secondary | ICD-10-CM | POA: Diagnosis not present

## 2014-09-09 DIAGNOSIS — F411 Generalized anxiety disorder: Secondary | ICD-10-CM

## 2014-09-09 MED ORDER — FLUOXETINE HCL 40 MG PO CAPS: 40.0000 mg | ORAL_CAPSULE | Freq: Every day | ORAL | Status: DC

## 2014-09-09 MED ORDER — RANITIDINE HCL 150 MG PO TABS: 150.0000 mg | ORAL_TABLET | ORAL | Status: DC | PRN

## 2014-09-09 MED ORDER — CLONAZEPAM 1 MG PO TABS: 1.0000 mg | ORAL_TABLET | Freq: Two times a day (BID) | ORAL | Status: DC | PRN

## 2014-09-09 NOTE — Patient Instructions (Signed)
Take 1/2 of Celexa today, and 1/4 of a Celexa tomorrow.  Start prozac on day three and take 1/2 tab for 1 week, and then increase to 40 mg daily.

## 2014-09-09 NOTE — Progress Notes (Signed)
09/09/2014 at 5:47 PM  Trevor Bates / DOB: 1995/08/09 / MRN: [id number]  The patient has Elevated blood pressure; Allergic rhinitis; Insomnia; Anxiety state; Snores; and Unspecified vitamin D deficiency on his problem list.  SUBJECTIVE  Chief complaint: Medication Refill and Heartburn   History of present illness: Mr. Trevor Bates is 19 y.o. well appearing male presenting for a refill of klonopin.  He reports being diagnosed with anxiety roughly 2 months ago and insomnia due to racing thoughts was his worst symptom. He was placed on klonopin 1 mg bid along with celexa 20 qd at that time and reports that he is now sleeping much better, however needs the klonopin to go to sleep.  He reports missing 3/7 doses of the Celexa simply because he forgets to take the medication.  He denies SI/HI today.   He complains of having GERD like symptoms when he eats out only.  He reports feeling "burning in his chest" that starts in the midline below the ribs and creeps up to his throat. This happens once or twice a week and is worse if he lies down after eating out.   He has been taking tums with good relief of the symptoms.  He denies SOB, diaphoresis, nausea, and presyncope with the indigestion. He is trying to lose twenty pounds in the hopes that this will ameliorate the problem.    He also complains of unchanged moderate left knee pain that started 1 month ago. The pain is associated with running, dead lifting and squatting only.  He denies pain in the morning, pain associated with walking or weather changes.  He denies swelling and point tenderness of the knees.  He has no history of previous knee injury.  He has tried single leg body weight squats with good to fair relief.   He  has a past medical history of Hyperlipidemia and Neuromuscular disorder.    He has a current medication list which includes the following prescription(s): desloratadine, triamcinolone, clonazepam, and celexa.  Mr. Trevor Bates has No Known  Allergies. He  reports that he has never smoked. He does not have any smokeless tobacco history on file. He reports that he uses illicit drugs (Marijuana). He reports that he does not drink alcohol. He  has no sexual activity history on file. The patient  has no past surgical history on file.  His family history includes Heart attack in his maternal grandmother; Hyperlipidemia in his mother; Thyroid disease in his maternal aunt. There is no history of Sudden death.  Review of Systems  Constitutional: Negative for fever.  Respiratory: Negative for cough.   Gastrointestinal: Positive for heartburn.  Musculoskeletal: Negative for myalgias.  Skin: Negative.   Neurological: Negative for dizziness.  Psychiatric/Behavioral: The patient is nervous/anxious.     OBJECTIVE  His  height is 5\' 6"  (1.676 m) and weight is 169 lb 6.4 oz (76.839 kg). His oral temperature is 99.2 F (37.3 C). His blood pressure is 149/69 and his pulse is 55. His respiration is 16 and oxygen saturation is 98%.  The patient's body mass index is 27.35 kg/(m^2).  Physical Exam  Constitutional: He is oriented to person, place, and time. He appears well-developed and well-nourished. No distress.  HENT:  Right Ear: Hearing, tympanic membrane, external ear and ear canal normal.  Left Ear: Hearing, tympanic membrane, external ear and ear canal normal.  Nose: No mucosal edema. Right sinus exhibits no maxillary sinus tenderness and no frontal sinus tenderness. Left sinus exhibits no maxillary sinus  tenderness and no frontal sinus tenderness.  Mouth/Throat: Uvula is midline and mucous membranes are normal. Mucous membranes are not pale, not dry and not cyanotic. No oropharyngeal exudate, posterior oropharyngeal edema, posterior oropharyngeal erythema or tonsillar abscesses.  Cardiovascular: Normal rate and regular rhythm.   Respiratory: Effort normal and breath sounds normal. He has no wheezes. He has no rales.  Musculoskeletal: Normal  range of motion.       Left hip: Normal.       Right knee: Normal.       Left knee: He exhibits normal range of motion, no swelling, no effusion, no ecchymosis, no deformity, no erythema, normal alignment, no LCL laxity, normal patellar mobility, no bony tenderness, normal meniscus and no MCL laxity. No tenderness found.       Left ankle: Normal.  Lymphadenopathy:    He has no cervical adenopathy.  Neurological: He is alert and oriented to person, place, and time.  Skin: Skin is warm and dry. He is not diaphoretic.  Psychiatric: He has a normal mood and affect.    No results found for this or any previous visit (from the past 24 hour(s)).  ASSESSMENT & PLAN  Trevor Bates was seen today for medication refill and heartburn.  Diagnoses and all orders for this visit:  Anxiety state: Patient missing too many doses of citalopram, and is now back for a refill of klonopin.  I take this to mean that his anxiety remains poorly controlled and he is now relying on klonopin for sleep.  Advised patient that we change SSRI to Prozac given its long half life. Explained that klonopin is a bridge therapy for initiating SSRI therapy, and he should not need this medication once his anxiety is controlled with an SSRI.  Patient amenable to changing medication and voiced understanding of the plan.   Orders: -     FLUoxetine (PROZAC) 40 MG capsule; Take 1 capsule (40 mg total) by mouth daily. -     clonazePAM (KLONOPIN) 1 MG tablet; Take 1 tablet (1 mg total) by mouth 2 (two) times daily as needed for anxiety.  Left knee pain: Patient with normal knee exam and no history to suggest a bony etiology.  Will send to physical therapy for evaluation and treatment of this problem.  He has been to PT before for right sided shoulder pain related to a rotator cuff problem is now asymptomatic.  Orders: -     Ambulatory referral to Physical Therapy  Elevated blood pressure: Likely related to problem 1.  Will revaluate at one  month during f/u.   Gastroesophageal reflux disease, esophagitis presence not specified: Advised the patient to pursue weight loss and improve his diet.  Advised that when he does eat out to take Zantac 150 po before the meal to prevent indigestion.   I personally spent more than 25 minutes with this patient, with the majority of that time spent in discussion of the assessment and plan.    The patient was advised to call or come back to clinic if he does not see an improvement in symptoms, or worsens with the above plan.   Deliah Boston, MHS, PA-C [facility] 09/09/2014 5:47 PM

## 2014-10-20 ENCOUNTER — Ambulatory Visit: Payer: BLUE CROSS/BLUE SHIELD | Admitting: Physician Assistant

## 2015-02-16 ENCOUNTER — Ambulatory Visit: Payer: BLUE CROSS/BLUE SHIELD | Admitting: Family Medicine

## 2015-05-06 ENCOUNTER — Ambulatory Visit: Payer: BLUE CROSS/BLUE SHIELD | Admitting: Physician Assistant

## 2015-05-12 ENCOUNTER — Ambulatory Visit (INDEPENDENT_AMBULATORY_CARE_PROVIDER_SITE_OTHER): Payer: BLUE CROSS/BLUE SHIELD

## 2015-05-12 ENCOUNTER — Encounter: Payer: Self-pay | Admitting: Physician Assistant

## 2015-05-12 ENCOUNTER — Ambulatory Visit (INDEPENDENT_AMBULATORY_CARE_PROVIDER_SITE_OTHER): Payer: BLUE CROSS/BLUE SHIELD | Admitting: Physician Assistant

## 2015-05-12 VITALS — BP 128/80 | HR 56 | Temp 98.1°F | Resp 16 | Ht 66.0 in | Wt 161.4 lb

## 2015-05-12 DIAGNOSIS — Z23 Encounter for immunization: Secondary | ICD-10-CM

## 2015-05-12 DIAGNOSIS — R9389 Abnormal findings on diagnostic imaging of other specified body structures: Secondary | ICD-10-CM

## 2015-05-12 DIAGNOSIS — M25562 Pain in left knee: Secondary | ICD-10-CM

## 2015-05-12 DIAGNOSIS — R938 Abnormal findings on diagnostic imaging of other specified body structures: Secondary | ICD-10-CM

## 2015-05-12 DIAGNOSIS — F411 Generalized anxiety disorder: Secondary | ICD-10-CM

## 2015-05-12 LAB — COMPLETE METABOLIC PANEL WITH GFR
ALT: 19 U/L (ref 9–46)
AST: 15 U/L (ref 10–40)
Albumin: 4.7 g/dL (ref 3.6–5.1)
Alkaline Phosphatase: 69 U/L (ref 40–115)
BILIRUBIN TOTAL: 0.7 mg/dL (ref 0.2–1.2)
BUN: 15 mg/dL (ref 7–25)
CO2: 27 mmol/L (ref 20–31)
CREATININE: 0.99 mg/dL (ref 0.60–1.35)
Calcium: 9.7 mg/dL (ref 8.6–10.3)
Chloride: 101 mmol/L (ref 98–110)
GFR, Est African American: >89 mL/min (ref >=60)
GFR, Est Non African American: >89 mL/min (ref >=60)
GLUCOSE: 81 mg/dL (ref 65–99)
Potassium: 4.8 mmol/L (ref 3.5–5.3)
SODIUM: 139 mmol/L (ref 135–146)
TOTAL PROTEIN: 7.5 g/dL (ref 6.1–8.1)

## 2015-05-12 LAB — C-REACTIVE PROTEIN: CRP: <0.5 mg/dL (ref <0.60)

## 2015-05-12 MED ORDER — MELOXICAM 15 MG PO TABS: 15.0000 mg | ORAL_TABLET | Freq: Every day | ORAL | Status: DC

## 2015-05-12 NOTE — Progress Notes (Signed)
05/12/2015 4:40 PM   DOB: [birth year] / MRN: [id number]  SUBJECTIVE:  Trevor Bates is a 19 y.o. male presenting for knee pain that started roughly 1 year ago and has worsened since I last saw him in spring.  He now reports having pain daily, only with walking.  Reports the pain is moderate, and located interiolateral roughly 1 cm from the patella.  Reports some clicking and popping.   He tried taking 40 mg of prozac for roughly 6 weeks and stopped taking this as he felt it was not helping his symptoms, however reports that he was sleeping somewhat better.  Continues to complain of low mood, early morning awakenings, and anxious.  He is not willing to try another medication at this time.   He has No Known Allergies.   He  has a past medical history of Hyperlipidemia and Neuromuscular disorder (HCC).    He  reports that he has never smoked. He does not have any smokeless tobacco history on file. He reports that he uses illicit drugs (Marijuana). He reports that he does not drink alcohol. He  has no sexual activity history on file. The patient  has no past surgical history on file.  His family history includes Heart attack in his maternal grandmother; Hyperlipidemia in his mother; Thyroid disease in his maternal aunt. There is no history of Sudden death.  Review of Systems  Constitutional: Negative for fever and chills.  Eyes: Negative for blurred vision.  Respiratory: Negative for cough and shortness of breath.   Cardiovascular: Negative for chest pain.  Gastrointestinal: Negative for nausea and abdominal pain.  Genitourinary: Negative for dysuria, urgency and frequency.  Musculoskeletal: Negative for myalgias.  Skin: Negative for rash.  Neurological: Negative for dizziness, tingling and headaches.  Psychiatric/Behavioral: Negative for depression. The patient is nervous/anxious and has insomnia.     Problem list and medications reviewed and updated by myself where necessary, and exist  elsewhere in the encounter.   OBJECTIVE:  BP 128/80 mmHg  Pulse 56  Temp(Src) 98.1 F (36.7 C) (Oral)  Resp 16  Ht  (1.676 m)  Wt 161 lb 6.4 oz (73.211 kg)  BMI 26.06 kg/m2  SpO2 96% CrCl cannot be calculated (Patient has no serum creatinine result on file.).  Physical Exam  Constitutional: He is oriented to person, place, and time. He appears well-developed and well-nourished.  HENT:  Head: Normocephalic and atraumatic.  Right Ear: Hearing, tympanic membrane, external ear and ear canal normal.  Left Ear: Hearing, tympanic membrane, external ear and ear canal normal.  Nose: Nose normal.  Mouth/Throat: Uvula is midline, oropharynx is clear and moist and mucous membranes are normal.  Eyes: Conjunctivae, EOM and lids are normal. Pupils are equal, round, and reactive to light. Right eye exhibits no discharge. Left eye exhibits no discharge. No scleral icterus.  Neck: Trachea normal and normal range of motion. Neck supple. Carotid bruit is not present.  Cardiovascular: Normal rate, regular rhythm, normal heart sounds, intact distal pulses and normal pulses.   No murmur heard. Pulmonary/Chest: Effort normal and breath sounds normal. No respiratory distress. He has no wheezes. He has no rhonchi. He has no rales.  Abdominal: Soft. Normal appearance and bowel sounds are normal. He exhibits no abdominal bruit. There is no tenderness.  Musculoskeletal: Normal range of motion. He exhibits no edema or tenderness.       Legs: Lymphadenopathy:       Head (right side): No submental, no submandibular, no tonsillar,  no preauricular, no posterior auricular and no occipital adenopathy present.       Head (left side): No submental, no submandibular, no tonsillar, no preauricular, no posterior auricular and no occipital adenopathy present.    He has no cervical adenopathy.  Neurological: He is alert and oriented to person, place, and time. He has normal strength and normal reflexes. No cranial nerve  deficit or sensory deficit. Coordination and gait normal.  Skin: Skin is warm, dry and intact. No lesion and no rash noted.  Psychiatric: He has a normal mood and affect. His speech is normal and behavior is normal. Judgment and thought content normal.    No results found for this or any previous visit (from the past 48 hour(s)).  [facility] reading (PRIMARY) by  PA Clark: STAT read please comment.    ASSESSMENT AND PLAN  Trevor Bates was seen today for knee pain and insomnia.  Diagnoses and all orders for this visit:  Need for prophylactic vaccination and inoculation against influenza -     Flu Vaccine QUAD 36+ mos IM  Need for diphtheria-tetanus-pertussis (Tdap) vaccine, adult/adolescent -     Tdap vaccine greater than or equal to 7yo IM  Left knee pain: Patient with an interesting radiograph showing some obliteration of the tibial tuberosity.  Will send him to ortho and will evaluated pertinent labs.   -     DG Knee Complete 4 Views Left; Future -     AMB referral to orthopedics -     meloxicam (MOBIC) 15 MG tablet; Take 1 tablet (15 mg total) by mouth daily.  GAD (generalized anxiety disorder): Advised that he try 25-50 mg of benadryl for sleep.  Will send to psychology for an evaluation.   -     Ambulatory referral to Psychology  Abnormal radiograph -     COMPLETE METABOLIC PANEL WITH GFR -     Pathologist smear review -     Sedimentation Rate -     C-reactive protein    The patient was advised to call or return to clinic if he does not see an improvement in symptoms or to seek the care of the closest emergency department if he worsens with the above plan.   Deliah BostonMichael Clark, MHS, PA-C [facility] 05/12/2015 4:40 PM

## 2015-05-13 LAB — PATHOLOGIST SMEAR REVIEW

## 2015-05-13 LAB — SEDIMENTATION RATE: Sed Rate: 1 mm/hr (ref 0–15)

## 2015-07-01 ENCOUNTER — Ambulatory Visit (INDEPENDENT_AMBULATORY_CARE_PROVIDER_SITE_OTHER): Payer: BLUE CROSS/BLUE SHIELD | Admitting: Physician Assistant

## 2015-07-01 ENCOUNTER — Encounter: Payer: Self-pay | Admitting: Physician Assistant

## 2015-07-01 VITALS — BP 138/70 | HR 68 | Temp 97.9°F | Resp 17 | Ht 67.0 in | Wt 165.0 lb

## 2015-07-01 DIAGNOSIS — H6121 Impacted cerumen, right ear: Secondary | ICD-10-CM | POA: Diagnosis not present

## 2015-07-01 NOTE — Patient Instructions (Signed)
Please do not insert anything into your ear that is smaller than your elbow.  This includes QTips, keys, hair pins, etc.   If your ears produce extra wax, you can help reduce the build up by:  1) allow the soapy water to drip down into your ear canals when you wash your hair (it can dissolve the wax the same way that dishwashing liquid dissolves grease), and/or  2) mix Hydrogen Peroxide half-and-half with water (DO NOT USE HYDROGEN PEROXIDE WITHOUT DILUTING IT WITH WATER as it can burn the skin in your ear); pour a little of this mixture into each ear canal while in the shower 2-3 times each week.  If your ears are itchy, place several drops of Sweet Oil into each canal to soothe the itching.  If it's not effective, place a small amount of hydrocortisone ointment on the tip of your pinky finger and rub it gently in the ear canal.  The heat of your body will melt the ointment, allowing it to spread in your ear canal and reduce the itching.  

## 2015-07-01 NOTE — Progress Notes (Signed)
   Patient ID: Trevor Bates, male    DOB: 10/08/1995, 20 y.o.   MRN: [id number]  PCP: No primary care provider on file.  Subjective:   Chief Complaint  Patient presents with  . clogged ear    HPI Presents for evaluation of clogged sensation in the RIGHT ear.  Similar symptoms previously caused by cerumen impaction, and he had complete resolution with irrigation.    Review of Systems  Constitutional: Negative for fever, chills and fatigue.  HENT: Negative for congestion, rhinorrhea, sinus pressure and sore throat.   Eyes: Negative for visual disturbance.  Respiratory: Negative for cough.   Neurological: Negative for dizziness and headaches.  Hematological: Negative for adenopathy.       Patient Active Problem List   Diagnosis Date Noted  . Unspecified vitamin D deficiency 10/20/2013  . Allergic rhinitis 09/08/2013  . Insomnia 09/08/2013  . Anxiety state 09/08/2013  . Snores 09/08/2013  . Elevated blood pressure 07/13/2011     Prior to Admission medications   Medication Sig Start Date End Date Taking? Authorizing Provider  meloxicam (MOBIC) 15 MG tablet Take 1 tablet (15 mg total) by mouth daily. 05/12/15  Yes Ofilia Neas, PA-C     No Known Allergies     Objective:  Physical Exam  Constitutional: He is oriented to person, place, and time. He appears well-developed and well-nourished. He is active and cooperative. No distress.  BP 138/70 mmHg  Pulse 68  Temp(Src) 97.9 F (36.6 C) (Oral)  Resp 17  Ht  (1.702 m)  Wt 165 lb (74.844 kg)  BMI 25.84 kg/m2  SpO2 97%   HENT:  Head: Normocephalic and atraumatic.  Right Ear: Hearing and external ear normal. A foreign body (cerumen in the canal) is present.  Left Ear: Hearing, tympanic membrane, external ear and ear canal normal.  Nose: Nose normal.  Mouth/Throat: Uvula is midline, oropharynx is clear and moist and mucous membranes are normal. No oral lesions.  Eyes: Conjunctivae are normal.    Pulmonary/Chest: Effort normal.  Neurological: He is alert and oriented to person, place, and time.  Psychiatric: He has a normal mood and affect. His speech is normal and behavior is normal.           Assessment & Plan:   1. Cerumen impaction, right Resolved with irrigation, with complete resolution of symptoms. Discussed healthy ear care.   Fernande Bras, PA-C Physician Assistant-Certified [facility]

## 2015-07-29 ENCOUNTER — Ambulatory Visit (INDEPENDENT_AMBULATORY_CARE_PROVIDER_SITE_OTHER): Payer: BLUE CROSS/BLUE SHIELD | Admitting: Physician Assistant

## 2015-07-29 VITALS — BP 144/100 | HR 63 | Temp 97.4°F | Resp 14 | Ht 67.0 in | Wt 169.0 lb

## 2015-07-29 DIAGNOSIS — T7840XA Allergy, unspecified, initial encounter: Secondary | ICD-10-CM

## 2015-07-29 DIAGNOSIS — Z889 Allergy status to unspecified drugs, medicaments and biological substances status: Secondary | ICD-10-CM

## 2015-07-29 MED ORDER — RANITIDINE HCL 150 MG PO TABS: 150.0000 mg | ORAL_TABLET | Freq: Two times a day (BID) | ORAL | Status: DC

## 2015-07-29 MED ORDER — HYDROXYZINE HCL 25 MG PO TABS: 12.5000 mg | ORAL_TABLET | Freq: Three times a day (TID) | ORAL | Status: DC | PRN

## 2015-07-29 MED ORDER — CETIRIZINE HCL 10 MG PO TABS: 10.0000 mg | ORAL_TABLET | Freq: Every day | ORAL | Status: DC

## 2015-07-29 NOTE — Progress Notes (Signed)
[facility] [address], [place] [address] [phone]  Date:  07/29/2015   Name:  Trevor Bates   DOB:  August 09, 1995   MRN:  [id number]  PCP:  No primary care provider on file.    History of Present Illness:  Trevor Bates is a 20 y.o. male patient who presents to [facility] for cc of rash.    3 days of intensely pruritic rash, that migrates along the body--from his elbows to his thighs, to his groin, to chest...this.  He has never had this before.  Last night he took benadryl which helped him sleep.  He has no known allergies.  Only routine medication is minocycline which he started 1 month ago.  He discontinued taking 3 days ago with onset of rash.   He has no sob or dyspnea.  No oral swelling or vision chanes.  He has had no outdoor activity.  No new soaps or hygeine products of any kind.  He thought he felt his throat getting tight 2 days ago, but this quickly resolved.  Patient Active Problem List   Diagnosis Date Noted  . Unspecified vitamin D deficiency 10/20/2013  . Allergic rhinitis 09/08/2013  . Insomnia 09/08/2013  . Anxiety state 09/08/2013  . Snores 09/08/2013  . Elevated blood pressure 07/13/2011    Past Medical History  Diagnosis Date  . Hyperlipidemia     told by MD before he had some high blood pressure - no work up or treatment  . Neuromuscular disorder (HCC)     No past surgical history on file.  Social History  Substance Use Topics  . Smoking status: Never Smoker   . Smokeless tobacco: Never Used  . Alcohol Use: No    Family History  Problem Relation Age of Onset  . Heart attack Maternal Grandmother   . Sudden death Neg Hx   . Hyperlipidemia Mother   . Thyroid disease Maternal Aunt     Allergies  Allergen Reactions  . Minocycline Hives and Rash    Medication list has been reviewed and updated.  No current outpatient prescriptions on file prior to visit.   No current facility-administered medications on file prior to  visit.    ROS ROS otherwise unremarkable unless listed above.   Physical Examination: BP 144/100 mmHg  Pulse 63  Temp(Src) 97.4 F (36.3 C) (Oral)  Resp 14  Ht  (1.702 m)  Wt 169 lb (76.658 kg)  BMI 26.46 kg/m2  SpO2 98% Ideal Body Weight: Weight in (lb) to have BMI = 25: 159.3  Physical Exam  Constitutional: He is oriented to person, place, and time. He appears well-developed and well-nourished. No distress.  HENT:  Head: Normocephalic and atraumatic.  Eyes: Conjunctivae and EOM are normal. Pupils are equal, round, and reactive to light.  Cardiovascular: Normal rate.   Pulmonary/Chest: Effort normal. No respiratory distress.  Neurological: He is alert and oriented to person, place, and time.  Skin: Skin is warm and dry. He is not diaphoretic.  Right thigh with erythematous patch with excoriation.   Center of back also erythematous along the center back--excoriated welts.  Psychiatric: He has a normal mood and affect. His behavior is normal.     Assessment and Plan: [name] is a 20 y.o. male who is here today for rash. This appears to be an allergir reaction secondary to the minocycline.  We wiil treat by driving histamine down.   Alarming sxs discussed to warrant  immediate return.   Allergic drug reaction - Plan: cetirizine (ZYRTEC) 10 MG tablet, ranitidine (ZANTAC) 150 MG tablet, hydrOXYzine (ATARAX/VISTARIL) 25 MG tablet  [name], PA-C [facility] 07/29/2015 10:54 AM

## 2015-07-29 NOTE — Patient Instructions (Signed)
Please take the medicines as prescribed.  If you develop any shortness of breath, feel like your throat is closing, tongue swelling, etc--you need to return immediately or go to the emergency department. I would like you to return if your symptoms have not resolved after 1 week.   Drug Allergy Allergic reactions to medicines are common. Some allergic reactions are mild. A delayed type of drug allergy that occurs 1 week or more after exposure to a medicine or vaccine is called serum sickness. A life-threatening, sudden (acute) allergic reaction that involves the whole body is called anaphylaxis. CAUSES  "True" drug allergies occur when there is an allergic reaction to a medicine. This is caused by overactivity of the immune system. First, the body becomes sensitized. The immune system is triggered by your first exposure to the medicine. Following this first exposure, future exposure to the same medicine may be life-threatening. Almost any medicine can cause an allergic reaction. Common ones are:  Penicillin.  Sulfonamides (sulfa drugs).  Local anesthetics.  X-ray dyes that contain iodine. SYMPTOMS  Common symptoms of a minor allergic reaction are:  Swelling around the mouth.  An itchy red rash or hives.  Vomiting or diarrhea. Anaphylaxis can cause swelling of the mouth and throat. This makes it difficult to breathe and swallow. Severe reactions can be fatal within seconds, even after exposure to only a trace amount of the drug that causes the reaction. HOME CARE INSTRUCTIONS  If you are unsure of what caused your reaction, write down:  The names of the medicines you took.  How much medicine you took.  How you took the medicine, such as whether you took a pill, injected the medicine, or applied it to your skin.  All of the things you ate and drank.  The date and time of your reaction.  The symptoms of the reaction.  You may want to follow up with an allergy specialist after the  reaction has cleared in order to be tested to confirm the allergy. It is important to confirm that your reaction is an allergy, not just a side effect to the medicine. If you have a true allergy to a medicine, this may prevent that medicine and related medicines from being given to you when you are very ill.  If you have hives or a rash:  Take medicines as directed by your caregiver.  You may use an over-the-counter antihistamine (diphenhydramine) as needed.  Apply cold compresses to the skin or take baths in cool water. Avoid hot baths or showers.  If you are severely allergic:  Continuous observation after a severe reaction may be needed. Hospitalization is often required.  Wear a medical alert bracelet or necklace stating your allergy.  You and your family must learn how to use an anaphylaxis kit or give an epinephrine injection to temporarily treat an emergency allergic reaction. If you have had a severe reaction, always carry your epinephrine injection or anaphylaxis kit with you. This can be lifesaving if you have a severe reaction.  Do not drive or perform tasks after treatment until the medicines used to treat your reaction have worn off, or until your caregiver says it is okay.  If you have a drug allergy that was confirmed by your health care provider:  Carry information about the drug allergy with you at all times.  Always check with a pharmacist before taking any over-the-counter medicine. SEEK MEDICAL CARE IF:   You think you had an allergic reaction. Symptoms usually start  within 30 minutes after exposure.  Symptoms are getting worse rather than better.  You develop new symptoms.  The symptoms that brought you to your caregiver return. SEEK IMMEDIATE MEDICAL CARE IF:   You have swelling of the mouth, difficulty breathing, or wheezing.  You have a tight feeling in your chest or throat.  You develop hives, swelling, or itching all over your body.  You develop  severe vomiting or diarrhea.  You feel faint or pass out. This is an emergency. Use your epinephrine injection or anaphylaxis kit as you have been instructed. Call for emergency medical help. Even if you improve after the injection, you need to be examined at a hospital emergency department. MAKE SURE YOU:   Understand these instructions.  Will watch your condition.  Will get help right away if you are not doing well or get worse.   This information is not intended to replace advice given to you by your health care provider. Make sure you discuss any questions you have with your health care provider.   Document Released: 05/16/2005 Document Revised: 06/06/2014 Document Reviewed: 12/16/2014 Elsevier Interactive Patient Education [employer].

## 2015-08-04 ENCOUNTER — Encounter: Payer: Self-pay | Admitting: Physician Assistant

## 2015-08-12 ENCOUNTER — Ambulatory Visit: Payer: BLUE CROSS/BLUE SHIELD | Admitting: Physician Assistant

## 2015-12-03 DIAGNOSIS — L700 Acne vulgaris: Secondary | ICD-10-CM | POA: Diagnosis not present

## 2015-12-03 DIAGNOSIS — L905 Scar conditions and fibrosis of skin: Secondary | ICD-10-CM | POA: Diagnosis not present

## 2016-07-06 ENCOUNTER — Ambulatory Visit (INDEPENDENT_AMBULATORY_CARE_PROVIDER_SITE_OTHER): Payer: BLUE CROSS/BLUE SHIELD | Admitting: Physician Assistant

## 2016-07-06 VITALS — BP 122/72 | HR 59 | Temp 98.4°F | Resp 17 | Ht 67.0 in | Wt 172.0 lb

## 2016-07-06 DIAGNOSIS — M62830 Muscle spasm of back: Secondary | ICD-10-CM

## 2016-07-06 MED ORDER — MELOXICAM 15 MG PO TABS: 15.0000 mg | ORAL_TABLET | Freq: Every day | ORAL | 1 refills | Status: DC

## 2016-07-06 MED ORDER — CYCLOBENZAPRINE HCL 10 MG PO TABS: 5.0000 mg | ORAL_TABLET | Freq: Three times a day (TID) | ORAL | 1 refills | Status: DC | PRN

## 2016-07-06 NOTE — Patient Instructions (Addendum)
Please use anti-inflammatory, meloxicam, for the back pain.  Take one only as needed once per day.  Do not take ibuprofen or naproxen with this.  You can take tylenol. Careful of sedation with the flexeril.  Ice the back three times per day. Please perform stretches daily. Three pictures three times per day--followed by the ice.   Mid-Back Strain Rehab Ask your health care provider which exercises are safe for you. Do exercises exactly as told by your health care provider and adjust them as directed. It is normal to feel mild stretching, pulling, tightness, or discomfort as you do these exercises, but you should stop right away if you feel sudden pain or your pain gets worse. Do not begin these exercises until told by your health care provider. Stretching and range of motion exercises This exercise warms up your muscles and joints and improves the movement and flexibility of your back and shoulders. This exercise also help to relieve pain. Exercise A: Chest and spine stretch 1. Lie down on your back on a firm surface. 2. Roll a towel or a small blanket so it is about 4 inches (10 cm) in diameter. 3. Put the towel lengthwise under the middle of your back so it is under your spine, but not under your shoulder blades. 4. To increase the stretch, you may put your hands behind your head and let your elbows fall to your sides. 5. Hold for __________ seconds. Repeat exercise __________ times. Complete this exercise __________ times a day. Strengthening exercises These exercises build strength and endurance in your back and your shoulder blade muscles. Endurance is the ability to use your muscles for a long time, even after they get tired. Exercise B: Alternating arm and leg raises 1. Get on your hands and knees on a firm surface. If you are on a hard floor, you may want to use padding to cushion your knees, such as an exercise mat. 2. Line up your arms and legs. Your hands should be below your  shoulders, and your knees should be below your hips. 3. Lift your left leg behind you. At the same time, raise your right arm and straighten it in front of you.  Do not lift your leg higher than your hip.  Do not lift your arm higher than your shoulder.  Keep your abdominal and back muscles tight.  Keep your hips facing the ground.  Do not arch your back.  Keep your balance carefully, and do not hold your breath. 4. Hold for __________ seconds. 5. Slowly return to the starting position and repeat with your right leg and your left arm. Repeat __________ times. Complete this exercise __________ times a day. Exercise C: Straight arm rows (shoulder extension) 1. Stand with your feet shoulder width apart. 2. Secure an exercise band to a stable object in front of you so the band is at or above shoulder height. 3. Hold one end of the exercise band in each hand. 4. Straighten your elbows and lift your hands up to shoulder height. 5. Step back, away from the secured end of the exercise band, until the band stretches. 6. Squeeze your shoulder blades together and pull your hands down to the sides of your thighs. Stop when your hands are straight down by your sides. Do not let your hands go behind your body. 7. Hold for __________ seconds. 8. Slowly return to the starting position. Repeat __________ times. Complete this exercise __________ times a day. Exercise D: Shoulder external rotation,  prone 1. Lie on your abdomen on a firm bed so your left / right forearm hangs over the edge of the bed and your upper arm is on the bed, straight out from your body.  Your elbow should be bent.  Your palm should be facing your feet. 2. If instructed, hold a __________ weight in your hand. 3. Squeeze your shoulder blade toward the middle of your back. Do not let your shoulder lift toward your ear. 4. Keep your elbow bent in an "L" shape (90 degrees) while you slowly move your forearm up toward the ceiling.  Move your forearm up to the height of the bed, toward your head.  Your upper arm should not move.  At the top of the movement, your palm should face the floor. 5. Hold for __________ seconds. 6. Slowly return to the starting position and relax your muscles. Repeat __________ times. Complete this exercise __________ times a day. Exercise E: Scapular retraction and external rotation, rowing 1. Sit in a stable chair without armrests, or stand. 2. Secure an exercise band to a stable object in front of you so it is at shoulder height. 3. Hold one end of the exercise band in each hand. 4. Bring your arms out straight in front of you. 5. Step back, away from the secured end of the exercise band, until the band stretches. 6. Pull the band backward. As you do this, bend your elbows and squeeze your shoulder blades together, but avoid letting the rest of your body move. Do not let your shoulders lift up toward your ears. 7. Stop when your elbows are at your sides or slightly behind your body. 8. Hold for __________ seconds. 9. Slowly straighten your arms to return to the starting position. Repeat __________ times. Complete this exercise __________ times a day. Posture and body mechanics   Body mechanics refers to the movements and positions of your body while you do your daily activities. Posture is part of body mechanics. Good posture and healthy body mechanics can help to relieve stress in your body's tissues and joints. Good posture means that your spine is in its natural S-curve position (your spine is neutral), your shoulders are pulled back slightly, and your head is not tipped forward. The following are general guidelines for applying improved posture and body mechanics to your everyday activities. Standing   When standing, keep your spine neutral and your feet about hip-width apart. Keep a slight bend in your knees. Your ears, shoulders, and hips should line up.  When you do a task in which  you lean forward while standing in one place for a long time, place one foot up on a stable object that is 2-4 inches (5-10 cm) high, such as a footstool. This helps keep your spine neutral. Sitting  When sitting, keep your spine neutral and keep your feet flat on the floor. Use a footrest, if necessary, and keep your thighs parallel to the floor. Avoid rounding your shoulders, and avoid tilting your head forward.  When working at a desk or a computer, keep your desk at a height where your hands are slightly lower than your elbows. Slide your chair under your desk so you are close enough to maintain good posture.  When working at a computer, place your monitor at a height where you are looking straight ahead and you do not have to tilt your head forward or downward to look at the screen. Resting   When lying down and resting,  avoid positions that are most painful for you.  If you have pain with activities such as sitting, bending, stooping, or squatting (flexion-based activities), lie in a position in which your body does not bend very much. For example, avoid curling up on your side with your arms and knees near your chest (fetal position).  If you have pain with activities such as standing for a long time or reaching with your arms (extension-based activities), lie with your spine in a neutral position and bend your knees slightly. Try the following positions:  Lying on your side with a pillow between your knees.  Lying on your back with a pillow under your knees. Lifting   When lifting objects, keep your feet at least shoulder-width apart and tighten your abdominal muscles.  Bend your knees and hips and keep your spine neutral. It is important to lift using the strength of your legs, not your back. Do not lock your knees straight out.  Always ask for help to lift heavy or awkward objects. This information is not intended to replace advice given to you by your health care provider. Make  sure you discuss any questions you have with your health care provider. Document Released: 05/16/2005 Document Revised: 01/21/2016 Document Reviewed: 02/25/2015 Elsevier Interactive Patient Education  2017 [employer].     IF you received an x-ray today, you will receive an invoice from [facility]. Please contact [facility] at [phone] with questions or concerns regarding your invoice.   IF you received labwork today, you will receive an invoice from [facility]. Please contact [facility] at [phone] with questions or concerns regarding your invoice.   Our billing staff will not be able to assist you with questions regarding bills from these companies.  You will be contacted with the lab results as soon as they are available. The fastest way to get your results is to activate your My Chart account. Instructions are located on the last page of this paperwork. If you have not heard from [place] regarding the results in 2 weeks, please contact this office.

## 2016-07-10 NOTE — Progress Notes (Signed)
[facility] [address], [place] [address] [phone]  Date:  07/06/2016   Name:  Trevor Bates   DOB:  Jun 01, 1995   MRN:  [id number]  PCP:  No primary care provider on file.    History of Present Illness:  Trevor Bates is a 21 y.o. male patient who [facility] f or cc of back pain.     Patient has intermittent back pain at the upper left back. Patient has no numbness or tingling.  Patient states that he was prone to muscle spasms with heavy lifting.  He was doing manual labor in the past.  Currently working without any heavy lifting, but the pain can come up.  This was at the left side of his back.    Patient Active Problem List   Diagnosis Date Noted  . Unspecified vitamin D deficiency 10/20/2013  . Allergic rhinitis 09/08/2013  . Insomnia 09/08/2013  . Anxiety state 09/08/2013  . Snores 09/08/2013  . Elevated blood pressure 07/13/2011    Past Medical History:  Diagnosis Date  . Hyperlipidemia    told by MD before he had some high blood pressure - no work up or treatment  . Neuromuscular disorder (HCC)     No past surgical history on file.  Social History  Substance Use Topics  . Smoking status: Never Smoker  . Smokeless tobacco: Never Used  . Alcohol use No    Family History  Problem Relation Age of Onset  . Heart attack Maternal Grandmother   . Sudden death Neg Hx   . Hyperlipidemia Mother   . Thyroid disease Maternal Aunt     Allergies  Allergen Reactions  . Minocycline Hives and Rash    Medication list has been reviewed and updated.  No current outpatient prescriptions on file prior to visit.   No current facility-administered medications on file prior to visit.     ROS ROS otherwise unremarkable unless listed above.   Physical Examination: BP 122/72 (BP Location: Right Arm, Patient Position: Sitting, Cuff Size: Normal)   Pulse (!) 59   Temp 98.4 F (36.9 C) (Oral)   Resp 17   Ht 5\' 7"  (1.702 m)   Wt 172 lb  (78 kg)   SpO2 97%   BMI 26.94 kg/m  Ideal Body Weight: Weight in (lb) to have BMI = 25: 159.3  Physical Exam  Constitutional: He is oriented to person, place, and time. He appears well-developed and well-nourished. No distress.  HENT:  Head: Normocephalic and atraumatic.  Eyes: Conjunctivae and EOM are normal. Pupils are equal, round, and reactive to light.  Cardiovascular: Normal rate.   Pulmonary/Chest: Effort normal. No respiratory distress.  Musculoskeletal:       Cervical back: He exhibits normal range of motion, no tenderness, no bony tenderness and no swelling.       Thoracic back: He exhibits normal range of motion, no tenderness (pain incited with left lateral deviation to the right side.), no bony tenderness, no swelling, no edema, no deformity and no spasm.  Neurological: He is alert and oriented to person, place, and time.  Skin: Skin is warm and dry. He is not diaphoretic.  Psychiatric: He has a normal mood and affect. His behavior is normal.     Assessment and Plan: Trevor Bates is a 21 y.o. male who is here today for cc of mid back pain. Patient likely experience back pain secondary to strenuous labor and possible stress.  Please use anti-inflammatory, meloxicam, for the back pain.  Take one only as needed once per day.  Do not take ibuprofen or naproxen with this.  You can take tylenol. Careful of sedation with the flexeril.  Ice the back three times per day. Please perform stretches daily. Three pictures three times per day--followed by the ice.  Muscle spasm of back - Plan: cyclobenzaprine (FLEXERIL) 10 MG tablet, meloxicam (MOBIC) 15 MG tablet  [name], PA-C [facility] 2/11/20187:47 PM

## 2016-12-26 ENCOUNTER — Ambulatory Visit (INDEPENDENT_AMBULATORY_CARE_PROVIDER_SITE_OTHER): Payer: BLUE CROSS/BLUE SHIELD | Admitting: Physician Assistant

## 2016-12-26 ENCOUNTER — Encounter: Payer: Self-pay | Admitting: Physician Assistant

## 2016-12-26 VITALS — BP 140/81 | HR 58 | Temp 97.8°F | Resp 16 | Ht 67.0 in | Wt 174.6 lb

## 2016-12-26 DIAGNOSIS — M62830 Muscle spasm of back: Secondary | ICD-10-CM | POA: Diagnosis not present

## 2016-12-26 DIAGNOSIS — H0014 Chalazion left upper eyelid: Secondary | ICD-10-CM | POA: Diagnosis not present

## 2016-12-26 MED ORDER — CYCLOBENZAPRINE HCL 10 MG PO TABS: 5.0000 mg | ORAL_TABLET | Freq: Every day | ORAL | 0 refills | Status: DC

## 2016-12-26 NOTE — Patient Instructions (Addendum)
  Call me if you decide you'd like to see opthalmology.  Otherwise try a warm compress.    IF you received an x-ray today, you will receive an invoice from [facility]. Please contact [facility] at [phone] with questions or concerns regarding your invoice.   IF you received labwork today, you will receive an invoice from [facility]. Please contact [facility] at [phone] with questions or concerns regarding your invoice.   Our billing staff will not be able to assist you with questions regarding bills from these companies.  You will be contacted with the lab results as soon as they are available. The fastest way to get your results is to activate your My Chart account. Instructions are located on the last page of this paperwork. If you have not heard from us regarding the results in 2 weeks, please contact this office.

## 2016-12-26 NOTE — Progress Notes (Signed)
12/26/2016 3:27 PM   DOB: 05/29/1996 / MRN: [id number]  SUBJECTIVE:  Trevor Bates is a 21 y.o. male presenting for left upper eyelid irritation that started 7 months ago.  He has not tried anything for this. No trauma to the eye.   Tells me that he has a muscular imbalance in his back that causes him to twitch.  No pain.  No known exacerbating factors.   He is allergic to minocycline.   He  has a past medical history of Hyperlipidemia and Neuromuscular disorder (HCC).    He  reports that he has never smoked. He has never used smokeless tobacco. He reports that he uses drugs, including  and Marijuana. He reports that he does not drink alcohol. He  reports that he does not engage in sexual activity. The patient  has no past surgical history on file.  His family history includes Heart attack in his maternal grandmother; Hyperlipidemia in his mother; Thyroid disease in his maternal aunt.  Review of Systems  Eyes: Negative for blurred vision, double vision, photophobia, pain, discharge and redness.  Genitourinary: Negative for dysuria.  Musculoskeletal: Positive for myalgias. Negative for back pain, falls, joint pain and neck pain.  Skin: Negative for rash.  Neurological: Negative for dizziness.    The problem list and medications were reviewed and updated by myself where necessary and exist elsewhere in the encounter.   OBJECTIVE:  BP 140/81 (BP Location: Right Arm, Patient Position: Sitting, Cuff Size: Normal)   Pulse (!) 58   Temp 97.8 F (36.6 C) (Oral)   Resp 16   Ht 5\' 7"  (1.702 m)   Wt 174 lb 9.6 oz (79.2 kg)   SpO2 99%   BMI 27.35 kg/m   Physical Exam  Constitutional: He appears well-developed. He is active and cooperative.  Non-toxic appearance.  HENT:  Right Ear: Hearing, tympanic membrane, external ear and ear canal normal.  Left Ear: Hearing, tympanic membrane, external ear and ear canal normal.  Nose: Nose normal. Right sinus exhibits no maxillary sinus  tenderness and no frontal sinus tenderness. Left sinus exhibits no maxillary sinus tenderness and no frontal sinus tenderness.  Mouth/Throat: Uvula is midline, oropharynx is clear and moist and mucous membranes are normal. No oropharyngeal exudate, posterior oropharyngeal edema or tonsillar abscesses.  Eyes: Pupils are equal, round, and reactive to light. Conjunctivae are normal. Left eye exhibits no chemosis, no discharge, no exudate and no hordeolum. No foreign body present in the left eye. Left conjunctiva is not injected. Left conjunctiva has no hemorrhage. Left eye exhibits normal extraocular motion and no nystagmus. Left pupil is round and reactive.    Cardiovascular: Normal rate.   Pulmonary/Chest: Effort normal. No tachypnea.  Musculoskeletal:       Back:  Lymphadenopathy:       Head (right side): No submandibular and no tonsillar adenopathy present.       Head (left side): No submandibular and no tonsillar adenopathy present.    He has no cervical adenopathy.  Neurological: He is alert. He is not disoriented. He displays no tremor. No cranial nerve deficit or sensory deficit.  FTN normal to challenge.    Skin: Skin is warm and dry. He is not diaphoretic. No pallor.  Vitals reviewed.   Wt Readings from Last 3 Encounters:  12/26/16 174 lb 9.6 oz (79.2 kg)  07/06/16 172 lb (78 kg)  07/29/15 169 lb (76.7 kg)     No results found for this or any previous  visit (from the past 72 hour(s)).  No results found.  ASSESSMENT AND PLAN:  Trevor Bates was seen today for eye issue.  Diagnoses and all orders for this visit:  Chalazion of left upper eyelid: Tiny. Advised warm compress.  Will see him back as needed for this. In the event of infection would start keflex given allergy to minocycline.  Spasm of thoracic back muscle: Advised he try cupping or massage.  Flexeril as needed.  -     cyclobenzaprine (FLEXERIL) 10 MG tablet; Take 0.5-1 tablets (5-10 mg total) by mouth at bedtime. Do  not mix with alcohol.    The patient is advised to call or return to clinic if he does not see an improvement in symptoms, or to seek the care of the closest emergency department if he worsens with the above plan.   Deliah BostonMichael Clark, MHS, PA-C Primary Care at [facility] 12/26/2016 3:27 PM

## 2017-01-16 ENCOUNTER — Ambulatory Visit (INDEPENDENT_AMBULATORY_CARE_PROVIDER_SITE_OTHER): Payer: BLUE CROSS/BLUE SHIELD | Admitting: Physician Assistant

## 2017-01-16 ENCOUNTER — Encounter: Payer: Self-pay | Admitting: Physician Assistant

## 2017-01-16 VITALS — BP 119/61 | HR 64 | Temp 97.9°F | Resp 16 | Ht 67.0 in | Wt 172.0 lb

## 2017-01-16 DIAGNOSIS — H0019 Chalazion unspecified eye, unspecified eyelid: Secondary | ICD-10-CM | POA: Diagnosis not present

## 2017-01-16 MED ORDER — CEPHALEXIN 750 MG PO CAPS: 750.0000 mg | ORAL_CAPSULE | Freq: Two times a day (BID) | ORAL | 0 refills | Status: DC

## 2017-01-16 NOTE — Patient Instructions (Addendum)
  Start the antibiotic and reports to Dr. Laruth Bouchard office.     IF you received an x-ray today, you will receive an invoice from [facility]. Please contact [facility] at [phone] with questions or concerns regarding your invoice.   IF you received labwork today, you will receive an invoice from [place]. Please contact [facility] at [phone] with questions or concerns regarding your invoice.   Our billing staff will not be able to assist you with questions regarding bills from these companies.  You will be contacted with the lab results as soon as they are available. The fastest way to get your results is to activate your My Chart account. Instructions are located on the last page of this paperwork. If you have not heard from [place] regarding the results in 2 weeks, please contact this office.

## 2017-01-16 NOTE — Progress Notes (Signed)
    01/16/2017 2:43 PM   DOB: 10/31/1995 / MRN: [id number]  SUBJECTIVE:  Trevor Bates is a 21 y.o. male presenting for   He is allergic to minocycline.   He  has a past medical history of Hyperlipidemia and Neuromuscular disorder (HCC).    He  reports that he has never smoked. He has never used smokeless tobacco. He reports that he uses drugs, including  and Marijuana. He reports that he does not drink alcohol. He  reports that he does not engage in sexual activity. The patient  has no past surgical history on file.  His family history includes Heart attack in his maternal grandmother; Hyperlipidemia in his mother; Thyroid disease in his maternal aunt.  ROS  The problem list and medications were reviewed and updated by myself where necessary and exist elsewhere in the encounter.   OBJECTIVE:  BP 119/61 (BP Location: Right Arm, Patient Position: Sitting, Cuff Size: Large)   Pulse 64   Temp 97.9 F (36.6 C) (Oral)   Resp 16   Ht 5\' 7"  (1.702 m)   Wt 172 lb (78 kg)   SpO2 98%   BMI 26.94 kg/m     Physical Exam  Lab Results  Component Value Date   WBC 11.6 (H) 09/06/2013   HGB 15.1 09/06/2013   HCT 44.1 09/06/2013   MCV 86.5 09/06/2013   PLT 291 09/06/2013    Lab Results  Component Value Date   NA 139 05/12/2015   K 4.8 05/12/2015   CL 101 05/12/2015   CO2 27 05/12/2015    Lab Results  Component Value Date   CREATININE 0.99 05/12/2015    Lab Results  Component Value Date   ALT 19 05/12/2015   AST 15 05/12/2015   ALKPHOS 69 05/12/2015   BILITOT 0.7 05/12/2015    Lab Results  Component Value Date   TSH 0.911 09/06/2013    No results found for: HGBA1C  No results found for: CHOL, HDL, LDLCALC, LDLDIRECT, TRIG, CHOLHDL    No results found for this or any previous visit (from the past 72 hour(s)).  No results found.  ASSESSMENT AND PLAN:  Ming was seen today for chalazion.  Diagnoses and all orders for this visit:  Chalazion, unspecified  laterality -     Ambulatory referral to Ophthalmology  Other orders -     cephALEXin (KEFLEX) 750 MG capsule; Take 1 capsule (750 mg total) by mouth 2 (two) times daily.    The patient is advised to call or return to clinic if he does not see an improvement in symptoms, or to seek the care of the closest emergency department if he worsens with the above plan.   Deliah Boston, MHS, PA-C Primary Care at [facility] 01/16/2017 2:43 PM

## 2017-01-16 NOTE — Progress Notes (Signed)
    01/16/2017 2:51 PM   DOB: 03/14/1996 / MRN: [id number]  SUBJECTIVE:  Trevor Bates is a 21 y.o. male presenting for recheck of left eyelid complaint.  Tells me that he has tried warm compress and symptoms are not improving.  Has a minocycline allergy.  Feels that he is getting worse and now has a new lesion which is now throbbing.   He is allergic to minocycline.   He  has a past medical history of Hyperlipidemia and Neuromuscular disorder (HCC).    He  reports that he has never smoked. He has never used smokeless tobacco. He reports that he uses drugs, including  and Marijuana. He reports that he does not drink alcohol. He  reports that he does not engage in sexual activity. The patient  has no past surgical history on file.  His family history includes Heart attack in his maternal grandmother; Hyperlipidemia in his mother; Thyroid disease in his maternal aunt.  Review of Systems  Constitutional: Negative for chills, diaphoresis and fever.  Gastrointestinal: Negative for nausea.  Skin: Negative for rash.  Neurological: Negative for dizziness.    The problem list and medications were reviewed and updated by myself where necessary and exist elsewhere in the encounter.   OBJECTIVE:  BP 119/61 (BP Location: Right Arm, Patient Position: Sitting, Cuff Size: Large)   Pulse 64   Temp 97.9 F (36.6 C) (Oral)   Resp 16   Ht 5\' 7"  (1.702 m)   Wt 172 lb (78 kg)   SpO2 98%   BMI 26.94 kg/m   Physical Exam  Constitutional: He appears well-developed. He is active.  Non-toxic appearance.  Eyes:    Cardiovascular: Normal rate.   Skin: Skin is warm and dry. He is not diaphoretic. No pallor.    No results found for this or any previous visit (from the past 72 hour(s)).  No results found.  ASSESSMENT AND PLAN:  [name] was seen today for chalazion.  Diagnoses and all orders for this visit:  Chalazion, unspecified laterality: He is failing warm compress.  Will get him in with  optho for further eval and management while taking keflex.  -     Ambulatory referral to Ophthalmology -     cephALEXin (KEFLEX) 750 MG capsule; Take 1 capsule (750 mg total) by mouth 2 (two) times daily.    The patient is advised to call or return to clinic if he does not see an improvement in symptoms, or to seek the care of the closest emergency department if he worsens with the above plan.   Deliah Boston, MHS, PA-C Primary Care at [facility] 01/16/2017 2:51 PM

## 2017-04-03 ENCOUNTER — Ambulatory Visit: Payer: BLUE CROSS/BLUE SHIELD | Admitting: Physician Assistant

## 2017-04-04 ENCOUNTER — Ambulatory Visit: Payer: BLUE CROSS/BLUE SHIELD | Admitting: Physician Assistant

## 2017-04-04 ENCOUNTER — Encounter: Payer: Self-pay | Admitting: Physician Assistant

## 2017-04-04 VITALS — BP 112/60 | HR 80 | Temp 98.5°F | Resp 18 | Ht 67.0 in | Wt 164.0 lb

## 2017-04-04 DIAGNOSIS — J069 Acute upper respiratory infection, unspecified: Secondary | ICD-10-CM

## 2017-04-04 DIAGNOSIS — R1013 Epigastric pain: Secondary | ICD-10-CM | POA: Diagnosis not present

## 2017-04-04 DIAGNOSIS — B9789 Other viral agents as the cause of diseases classified elsewhere: Secondary | ICD-10-CM

## 2017-04-04 MED ORDER — AZELASTINE HCL 0.1 % NA SOLN: 2.0000 | Freq: Two times a day (BID) | NASAL | 0 refills | Status: DC

## 2017-04-04 MED ORDER — RANITIDINE HCL 150 MG PO TABS: 150.0000 mg | ORAL_TABLET | Freq: Two times a day (BID) | ORAL | 0 refills | Status: DC

## 2017-04-04 NOTE — Progress Notes (Signed)
Subjective:    Patient ID: Trevor Bates, male    DOB: 1995-10-11, 21 y.o.   MRN: [id number]  HPI  Trevor Bates is a 21 year old Asian male with a past medical history significant for allergic rhinitis who presents today with a chief complaint of abdominal pain and nasal congestion. Trevor Bates reports he has had an upset stomach for 4-5 days. He states it feels like he has to burp and he is also nauseas, but he is not vomiting. He states sometimes he is unable to burp even though he feels like he has to. He reports the pain is a 3/10. Patient describes the pain as an on and off dull pain that is worse when he slouches over. He also states after he eats the pain is worse. Patient has had a decreased appetite, because he is getting full faster. Trevor Bates reports he took Pepto-Bismol for the past 2-3 days. He states it was effective the first 1.5 days, but now does not help the pain. Trevor Bates denies diarrhea or constipation. He states his stool is a firm log in consistency.   Trevor Bates reports he also has nasal congestion. He states it started 4-5 days ago as well. Trevor Bates endorses a subjective fever, but states that has since resolved. Trevor Bates states he had a wet, productive cough for the past 4-5 days. He also endorses occasional sneezing. Patient reports he has been using TheraFlu which has improved his symptoms.    He also states his energy level has been normal, but he is not sleeping as well which has been impacting his energy.   Review of Systems  Constitutional: Positive for activity change, appetite change, fatigue and fever.  HENT: Positive for congestion and sneezing. Negative for ear discharge, ear pain, sinus pressure, sinus pain and sore throat.   Eyes: Negative for pain and discharge.  Respiratory: Positive for cough. Negative for shortness of breath.   Gastrointestinal: Positive for abdominal distention, abdominal pain and nausea. Negative for constipation, diarrhea and vomiting.   Medications:  Prior  to Admission medications   Medication Sig Start Date End Date Taking? Authorizing Provider  cephALEXin (KEFLEX) 750 MG capsule Take 1 capsule (750 mg total) by mouth 2 (two) times daily. Patient not taking: Reported on 04/04/2017 01/16/17   Ofilia Neaslark, Michael L, PA-C  cyclobenzaprine (FLEXERIL) 10 MG tablet Take 0.5-1 tablets (5-10 mg total) by mouth at bedtime. Do not mix with alcohol. Patient not taking: Reported on 04/04/2017 12/26/16   Ofilia Neaslark, Michael L, PA-C  meloxicam (MOBIC) 15 MG tablet Take 1 tablet (15 mg total) by mouth daily. Patient not taking: Reported on 12/26/2016 07/06/16   Trena PlattEnglish, Stephanie D, [place]   Allergies:  Allergies  Allergen Reactions  . Minocycline Hives and Rash   Chronic Medical Conditions:  Patient Active Problem List   Diagnosis Date Noted  . Unspecified vitamin D deficiency 10/20/2013  . Allergic rhinitis 09/08/2013  . Insomnia 09/08/2013  . Anxiety state 09/08/2013  . Snores 09/08/2013  . Elevated blood pressure 07/13/2011      Objective:   Physical Exam  Constitutional: He appears well-developed and well-nourished. He is active and cooperative. No distress.  BP 112/60 (BP Location: Left Arm, Patient Position: Sitting, Cuff Size: Normal)   Pulse 80   Temp 98.5 F (36.9 C) (Oral)   Resp 18   Ht 5\' 7"  (1.702 m)   Wt 164 lb (74.4 kg)   SpO2 97%   BMI 25.69 kg/m  Neurological: He is alert.   Patient deferred physical exam to attending provider.      Assessment & Plan:  1. Viral URI with cough - Begin Azelastine 0.1% nasal spray 2 sprays into both nostrils daily  - Continue to use TheraFlu as needed for nasal congestion and cough - Educated patient on the normal timeline of the common cold and informed him that his cough can last 2-3 weeks - Instructed patient to return to clinic if symptoms worsen, do not improve, or if he develops a fever  2. Dyspepsia - Begin Zantac 150mg  BID  - Provided patient with a list of things that often make reflux  symptoms worse and encouraged him to try to avoid them if possible - Instructed patient to return to clinic if symptoms worsen or do not improve  Dilyn Smiles, PA-S

## 2017-04-04 NOTE — Progress Notes (Signed)
Patient ID: Trevor Bates, male    DOB: [birth year], 21 y.o.   MRN: [id number]  PCP: Patient, No Pcp Per  Chief Complaint  Patient presents with  . Nausea    x4 days, pt states no vomiting but feels like he could, no diarrhea, pt states his stomach has a dull pain and keeps him up at night. Pt states he isn't eating as much and gets full fast.     Subjective:   Presents for evaluation of nausea x 4 days.  He's been dealing with a URI for the past several days, and is improving. He has a considerable stuffy nose and some coughing, but repeatedly says that he's not interested in talking about that, just wants to have the nausea addressed.  He describes sensation that he needs to belch, though sometimes he isn't able to. No vomiting. The discomfort is in the upper abdomen and intermittent, sometimes painful, 3/10. Discomfort is worse with slouching and after he eats. Early satiety. Anorexia.  He tried Pepto-Bismol, which was initially effective, but then ineffective after the first 36 hours.  No associated diarrhea, constipation, headache, dizziness, CP, SOB. Fever was present 4-5 days ago, associated with the URI, and resolved. Cough is occasionally productive, occasional sneezing. TheraFlu has been helpful.    Review of Systems Constitutional: Positive for activity change, appetite change, fatigue and fever.  HENT: Positive for congestion and sneezing. Negative for ear discharge, ear pain, sinus pressure, sinus pain and sore throat.   Eyes: Negative for pain and discharge.  Respiratory: Positive for cough. Negative for shortness of breath.   Gastrointestinal: Positive for abdominal distention, abdominal pain and nausea. Negative for constipation, diarrhea and vomiting.       Patient Active Problem List   Diagnosis Date Noted  . Unspecified vitamin D deficiency 10/20/2013  . Allergic rhinitis 09/08/2013  . Insomnia 09/08/2013  . Anxiety state 09/08/2013  . Snores  09/08/2013  . Elevated blood pressure 07/13/2011     Prior to Admission medications   Not on File      Allergies  Allergen Reactions  . Minocycline Hives and Rash       Objective:  Physical Exam  Constitutional: He is oriented to person, place, and time. He appears well-developed and well-nourished. He is active and cooperative. No distress.  BP 112/60 (BP Location: Left Arm, Patient Position: Sitting, Cuff Size: Normal)   Pulse 80   Temp 98.5 F (36.9 C) (Oral)   Resp 18   Ht 5\' 7"  (1.702 m)   Wt 164 lb (74.4 kg)   SpO2 97%   BMI 25.69 kg/m   HENT:  Head: Normocephalic and atraumatic.  Right Ear: Hearing, tympanic membrane, external ear and ear canal normal.  Left Ear: Hearing, tympanic membrane, external ear and ear canal normal.  Nose: Mucosal edema present. No rhinorrhea, nose lacerations, sinus tenderness, nasal deformity, septal deviation or nasal septal hematoma. No epistaxis.  No foreign bodies. Right sinus exhibits no maxillary sinus tenderness and no frontal sinus tenderness. Left sinus exhibits no maxillary sinus tenderness and no frontal sinus tenderness.  Mouth/Throat: Uvula is midline, oropharynx is clear and moist and mucous membranes are normal. No oral lesions. No oropharyngeal exudate.  Eyes: Conjunctivae, EOM and lids are normal. Pupils are equal, round, and reactive to light. No scleral icterus.  Neck: Normal range of motion, full passive range of motion without pain and phonation normal. Neck supple. No thyromegaly present.  Cardiovascular: Normal rate, regular rhythm  and normal heart sounds.  Pulses:      Radial pulses are 2+ on the right side, and 2+ on the left side.  Pulmonary/Chest: Effort normal and breath sounds normal.  Abdominal: Soft. Normal appearance and bowel sounds are normal. He exhibits no distension and no mass. There is no hepatosplenomegaly. There is tenderness (mild) in the epigastric area. There is no rigidity, no rebound, no  guarding, no CVA tenderness, no tenderness at McBurney's point and negative Murphy's sign.  Lymphadenopathy:       Head (right side): No tonsillar, no preauricular, no posterior auricular and no occipital adenopathy present.       Head (left side): No tonsillar, no preauricular, no posterior auricular and no occipital adenopathy present.    He has no cervical adenopathy.       Right: No supraclavicular adenopathy present.       Left: No supraclavicular adenopathy present.  Neurological: He is alert and oriented to person, place, and time. No sensory deficit.  Skin: Skin is warm, dry and intact. No rash noted. No cyanosis or erythema. Nails show no clubbing.  Psychiatric: He has a normal mood and affect. His speech is normal and behavior is normal.           Assessment & Plan:   1. Viral URI with cough Encouraged continued supportive care. - azelastine (ASTELIN) 0.1 % nasal spray; Place 2 sprays 2 (two) times daily into both nostrils. Use in each nostril as directed  Dispense: 30 mL; Refill: 0  2. Dyspepsia Likely GERD, possibly aggravated by the current URI symptoms. H2 blocker. If symptoms worsen/persist, RTC for additional evaluation. - ranitidine (ZANTAC) 150 MG tablet; Take 1 tablet (150 mg total) 2 (two) times daily by mouth.  Dispense: 60 tablet; Refill: 0    Return if symptoms worsen or fail to improve.   Fernande Brashelle S. Shanera Meske, PA-C Primary Care at [facility]

## 2017-04-04 NOTE — Patient Instructions (Addendum)
Things that often make reflux symptoms worse: Caffeine Carbonation (soda) Spicy foods Acidic foods (like tomato sauce, orange juice, lemonade) Fatty foods (including whole milk and ice cream) Stress (feeling sad, worried, nervous) Nicotine Alcohol NSAIDS (non-steroidal anti-inflammatories, like ibuprofen (Advil, Motrin) or naproxen (Aleve)).     IF you received an x-ray today, you will receive an invoice from [facility]. Please contact [facility] at [phone] with questions or concerns regarding your invoice.   IF you received labwork today, you will receive an invoice from [facility]. Please contact [facility] at [phone] with questions or concerns regarding your invoice.   Our billing staff will not be able to assist you with questions regarding bills from these companies.  You will be contacted with the lab results as soon as they are available. The fastest way to get your results is to activate your My Chart account. Instructions are located on the last page of this paperwork. If you have not heard from us regarding the results in 2 weeks, please contact this office.      

## 2017-06-16 DIAGNOSIS — L700 Acne vulgaris: Secondary | ICD-10-CM | POA: Diagnosis not present

## 2017-06-23 IMAGING — CR DG KNEE COMPLETE 4+V*L*
5 series · 5 of 5 positions shown · non-contrast
Comparison: None in PACs

CLINICAL DATA: Chronic left knee pain, no mention of trauma.

EXAM:
LEFT KNEE - COMPLETE 4+ VIEW

[lateral]
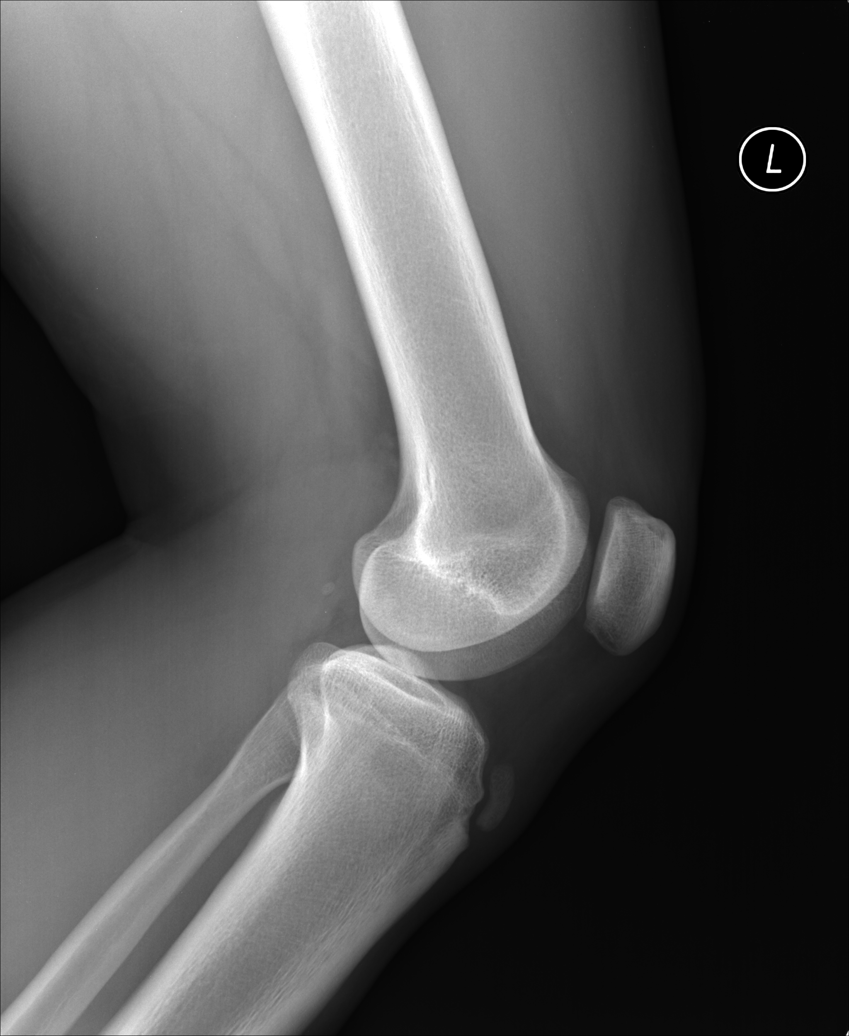

[AP]
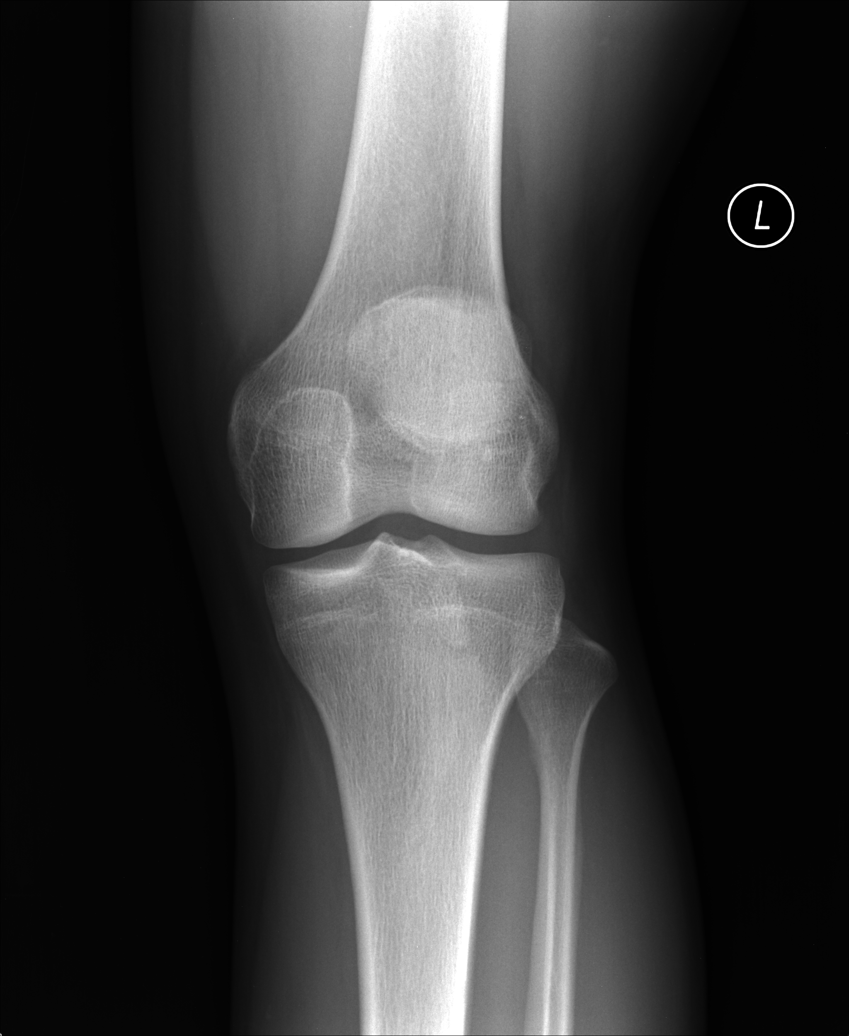

[other (1 of 3)]
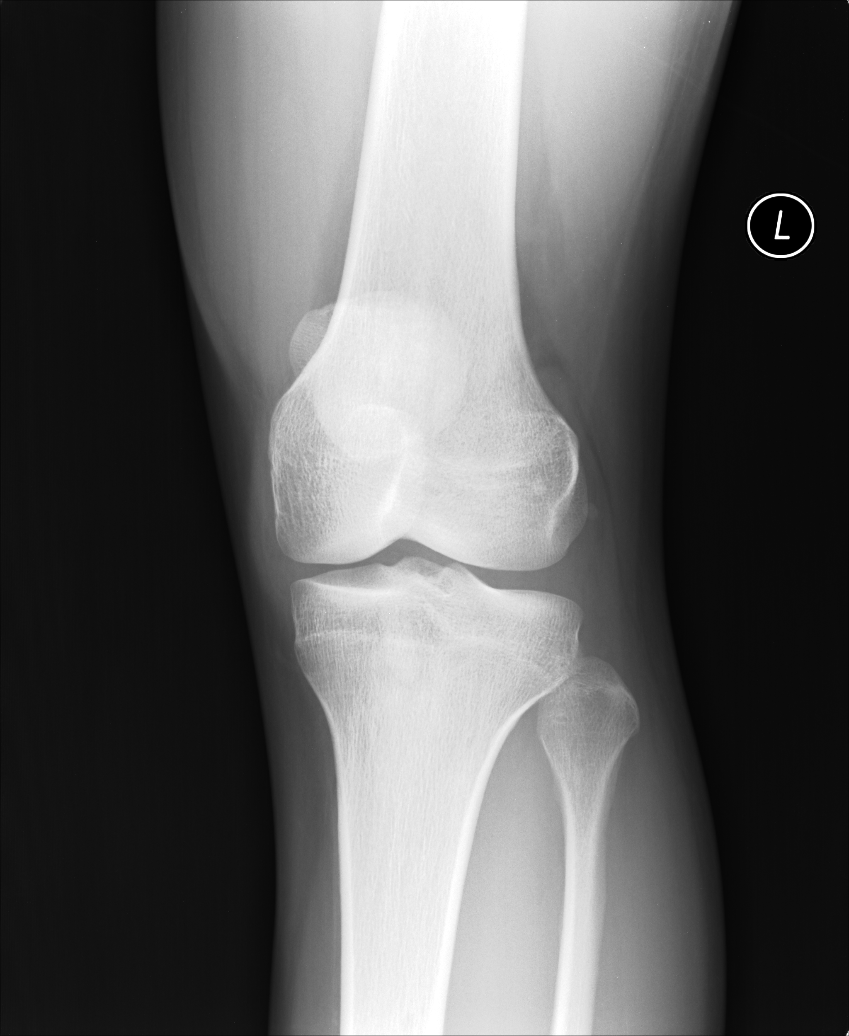

[other (2 of 3)]
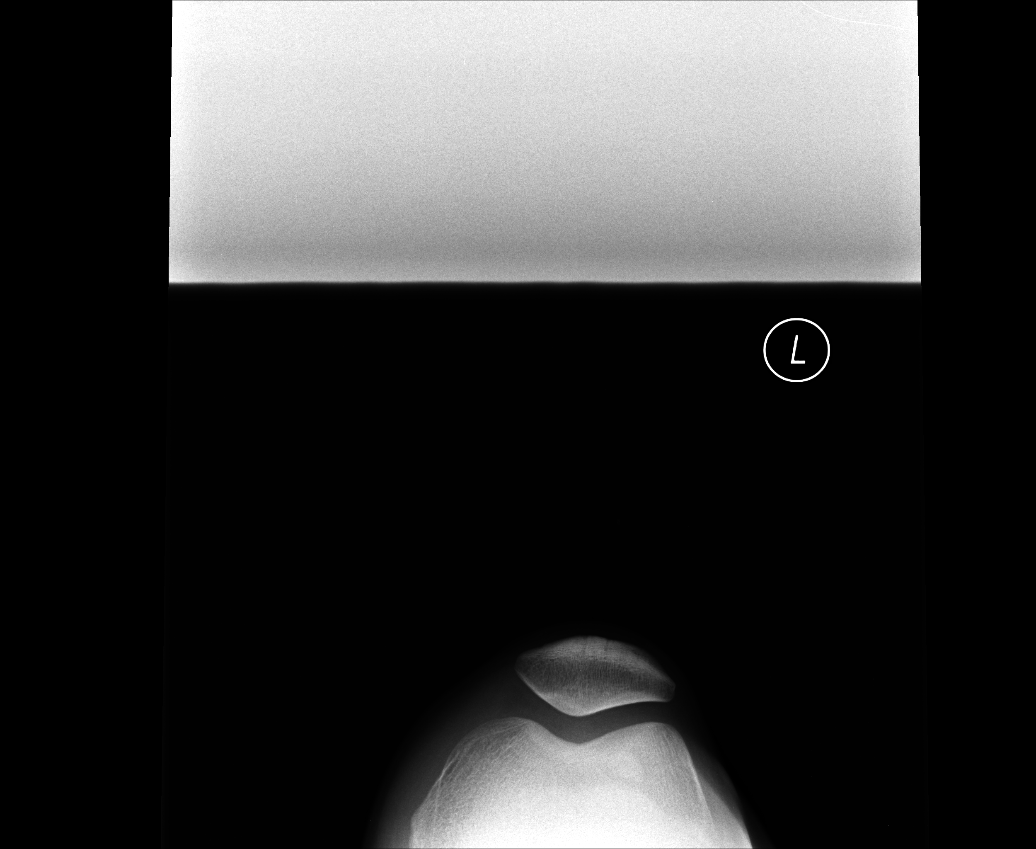

[other (3 of 3)]
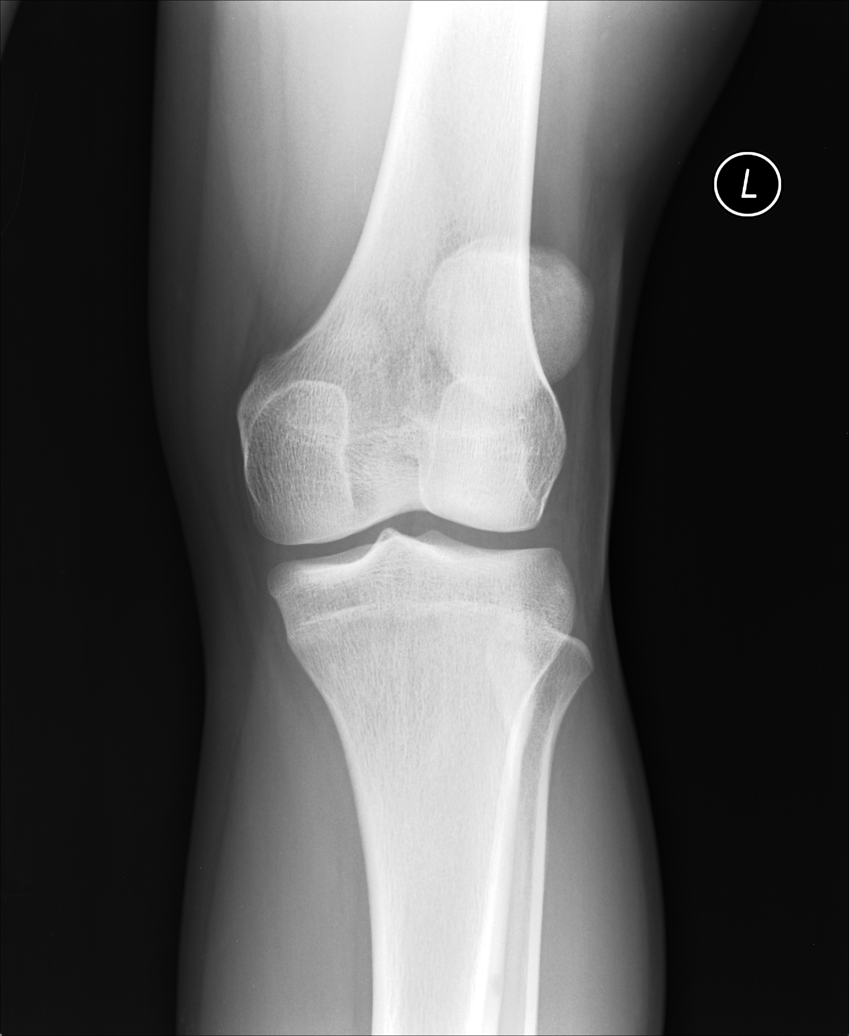

[5 of 5 positions shown; findings below may reference images not displayed]

FINDINGS: The bones of the left knee are adequately mineralized. The joint
spaces are preserved. There is no joint effusion or
chondrocalcinosis. There is some fragmentation of the tibial
tuberosity. There is no acute fracture nor dislocation.
IMPRESSION: There is no acute bony abnormality of the left knee. There is
fragmentation of the tibial tuberosity which is likely chronic.
There is no significant overlying soft tissue swelling. Correlation
with any symptoms here is needed in an effort to exclude
Osgood-Schlatter's disease.

## 2017-08-28 ENCOUNTER — Encounter: Payer: Self-pay | Admitting: Physician Assistant

## 2017-12-15 ENCOUNTER — Ambulatory Visit: Payer: BLUE CROSS/BLUE SHIELD | Admitting: Urgent Care

## 2017-12-19 NOTE — Progress Notes (Deleted)
     MRN: [id number] DOB: [birth year]  Subjective:   Trevor Bates is a 22 y.o. male presenting for chief complaint of No chief complaint on file. .  Reports *** history of {URI Symptoms :210800001}, {Systemic [phone]}. Has tried *** relief. Denies ***fever, {URI Symptoms :210800001}, {Systemic [phone]}. Has *** had *** sick contact with ***. *** history of seasonal allergies, history of asthma. Patient *** flu shot this season. *** smoking, *** alcohol. Denies any other aggravating or relieving factors, no other questions or concerns.  Trevor Bates has a current medication list which includes the following prescription(s): azelastine and ranitidine. Also is allergic to minocycline.  Trevor Bates  has a past medical history of Hyperlipidemia and Neuromuscular disorder (HCC). Also  has no past surgical history on file.   Objective:   Vitals: There were no vitals taken for this visit.  Physical Exam  No results found for this or any previous visit (from the past 24 hour(s)).  Assessment and Plan :  There are no diagnoses linked to this encounter.  Trevor CoreBrittany Wiseman, PA-C  Primary Care at [facility] 12/19/2017 10:27 PM

## 2017-12-20 ENCOUNTER — Ambulatory Visit: Payer: BLUE CROSS/BLUE SHIELD | Admitting: Physician Assistant

## 2017-12-22 ENCOUNTER — Encounter: Payer: Self-pay | Admitting: Urgent Care

## 2017-12-22 ENCOUNTER — Ambulatory Visit: Payer: BLUE CROSS/BLUE SHIELD | Admitting: Urgent Care

## 2017-12-22 ENCOUNTER — Other Ambulatory Visit: Payer: Self-pay

## 2017-12-22 VITALS — BP 121/76 | HR 61 | Temp 98.3°F | Resp 16 | Ht 67.0 in | Wt 174.6 lb

## 2017-12-22 DIAGNOSIS — H6983 Other specified disorders of Eustachian tube, bilateral: Secondary | ICD-10-CM

## 2017-12-22 DIAGNOSIS — J3089 Other allergic rhinitis: Secondary | ICD-10-CM | POA: Diagnosis not present

## 2017-12-22 DIAGNOSIS — H6993 Unspecified Eustachian tube disorder, bilateral: Secondary | ICD-10-CM

## 2017-12-22 MED ORDER — PSEUDOEPHEDRINE HCL ER 120 MG PO TB12: 120.0000 mg | ORAL_TABLET | Freq: Two times a day (BID) | ORAL | 3 refills | Status: DC

## 2017-12-22 MED ORDER — FLUTICASONE PROPIONATE 50 MCG/ACT NA SUSP: 2.0000 | Freq: Every day | NASAL | 11 refills | Status: DC

## 2017-12-22 MED ORDER — CETIRIZINE HCL 10 MG PO TABS: 10.0000 mg | ORAL_TABLET | Freq: Every day | ORAL | 1 refills | Status: DC

## 2017-12-22 NOTE — Patient Instructions (Addendum)
Allergic Rhinitis, Adult Allergic rhinitis is an allergic reaction that affects the mucous membrane inside the nose. It causes sneezing, a runny or stuffy nose, and the feeling of mucus going down the back of the throat (postnasal drip). Allergic rhinitis can be mild to severe. There are two types of allergic rhinitis:  Seasonal. This type is also called hay fever. It happens only during certain seasons.  Perennial. This type can happen at any time of the year.  What are the causes? This condition happens when the body's defense system (immune system) responds to certain harmless substances called allergens as though they were germs.  Seasonal allergic rhinitis is triggered by pollen, which can come from grasses, trees, and weeds. Perennial allergic rhinitis may be caused by:  House dust mites.  Pet dander.  Mold spores.  What are the signs or symptoms? Symptoms of this condition include:  Sneezing.  Runny or stuffy nose (nasal congestion).  Postnasal drip.  Itchy nose.  Tearing of the eyes.  Trouble sleeping.  Daytime sleepiness.  How is this diagnosed? This condition may be diagnosed based on:  Your medical history.  A physical exam.  Tests to check for related conditions, such as: ? Asthma. ? Pink eye. ? Ear infection. ? Upper respiratory infection.  Tests to find out which allergens trigger your symptoms. These may include skin or blood tests.  How is this treated? There is no cure for this condition, but treatment can help control symptoms. Treatment may include:  Taking medicines that block allergy symptoms, such as antihistamines. Medicine may be given as a shot, nasal spray, or pill.  Avoiding the allergen.  Desensitization. This treatment involves getting ongoing shots until your body becomes less sensitive to the allergen. This treatment may be done if other treatments do not help.  If taking medicine and avoiding the allergen does not work, new,  stronger medicines may be prescribed.  Follow these instructions at home:  Find out what you are allergic to. Common allergens include smoke, dust, and pollen.  Avoid the things you are allergic to. These are some things you can do to help avoid allergens: ? Replace carpet with wood, tile, or vinyl flooring. Carpet can trap dander and dust. ? Do not smoke. Do not allow smoking in your home. ? Change your heating and air conditioning filter at least once a month. ? During allergy season:  Keep windows closed as much as possible.  Plan outdoor activities when pollen counts are lowest. This is usually during the evening hours.  When coming indoors, change clothing and shower before sitting on furniture or bedding.  Take over-the-counter and prescription medicines only as told by your health care provider.  Keep all follow-up visits as told by your health care provider. This is important. Contact a health care provider if:  You have a fever.  You develop a persistent cough.  You make whistling sounds when you breathe (you wheeze).  Your symptoms interfere with your normal daily activities. Get help right away if:  You have shortness of breath. Summary  This condition can be managed by taking medicines as directed and avoiding allergens.  Contact your health care provider if you develop a persistent cough or fever.  During allergy season, keep windows closed as much as possible. This information is not intended to replace advice given to you by your health care provider. Make sure you discuss any questions you have with your health care provider. Document Released: 02/08/2001 Document Revised: 06/23/2016  Document Reviewed: 06/23/2016 Elsevier Interactive Patient Education  2018 Elsevier Inc.    Eustachian Tube Dysfunction The eustachian tube connects the middle ear to the back of the nose. It regulates air pressure in the middle ear by allowing air to move between the ear  and nose. It also helps to drain fluid from the middle ear space. When the eustachian tube does not function properly, air pressure, fluid, or both can build up in the middle ear. Eustachian tube dysfunction can affect one or both ears. What are the causes? This condition happens when the eustachian tube becomes blocked or cannot open normally. This may result from:  Ear infections.  Colds and other upper respiratory infections.  Allergies.  Irritation, such as from cigarette smoke or acid from the stomach coming up into the esophagus (gastroesophageal reflux).  Sudden changes in air pressure, such as from descending in an airplane.  Abnormal growths in the nose or throat, such as nasal polyps, tumors, or enlarged tissue at the back of the throat (adenoids).  What increases the risk? This condition may be more likely to develop in people who smoke and people who are overweight. Eustachian tube dysfunction may also be more likely to develop in children, especially children who have:  Certain birth defects of the mouth, such as cleft palate.  Large tonsils and adenoids.  What are the signs or symptoms? Symptoms of this condition may include:  A feeling of fullness in the ear.  Ear pain.  Clicking or popping noises in the ear.  Ringing in the ear.  Hearing loss.  Loss of balance.  Symptoms may get worse when the air pressure around you changes, such as when you travel to an area of high elevation or fly on an airplane. How is this diagnosed? This condition may be diagnosed based on:  Your symptoms.  A physical exam of your ear, nose, and throat.  Tests, such as those that measure: ? The movement of your eardrum (tympanogram). ? Your hearing (audiometry).  How is this treated? Treatment depends on the cause and severity of your condition. If your symptoms are mild, you may be able to relieve your symptoms by moving air into ("popping") your ears. If you have symptoms  of fluid in your ears, treatment may include:  Decongestants.  Antihistamines.  Nasal sprays or ear drops that contain medicines that reduce swelling (steroids).  In some cases, you may need to have a procedure to drain the fluid in your eardrum (myringotomy). In this procedure, a small tube is placed in the eardrum to:  Drain the fluid.  Restore the air in the middle ear space.  Follow these instructions at home:  Take over-the-counter and prescription medicines only as told by your health care provider.  Use techniques to help pop your ears as recommended by your health care provider. These may include: ? Chewing gum. ? Yawning. ? Frequent, forceful swallowing. ? Closing your mouth, holding your nose closed, and gently blowing as if you are trying to blow air out of your nose.  Do not do any of the following until your health care provider approves: ? Travel to high altitudes. ? Fly in airplanes. ? Work in a Estate agent or room. ? Scuba dive.  Keep your ears dry. Dry your ears completely after showering or bathing.  Do not smoke.  Keep all follow-up visits as told by your health care provider. This is important. Contact a health care provider if:  Your symptoms do  not go away after treatment.  Your symptoms come back after treatment.  You are unable to pop your ears.  You have: ? A fever. ? Pain in your ear. ? Pain in your head or neck. ? Fluid draining from your ear.  Your hearing suddenly changes.  You become very dizzy.  You lose your balance. This information is not intended to replace advice given to you by your health care provider. Make sure you discuss any questions you have with your health care provider. Document Released: 06/12/2015 Document Revised: 10/22/2015 Document Reviewed: 06/04/2014 Elsevier Interactive Patient Education  2018 [employer]   IF you received an x-ray today, you will receive an invoice from [facility].  Please contact [facility] at [phone] with questions or concerns regarding your invoice.   IF you received labwork today, you will receive an invoice from [facility]. Please contact [facility] at [phone] with questions or concerns regarding your invoice.   Our billing staff will not be able to assist you with questions regarding bills from these companies.  You will be contacted with the lab results as soon as they are available. The fastest way to get your results is to activate your My Chart account. Instructions are located on the last page of this paperwork. If you have not heard from us regarding the results in 2 weeks, please contact this office.

## 2017-12-22 NOTE — Progress Notes (Signed)
    MRN: [id number] DOB: Jul 06, 1995  Subjective:   Trevor Bates is a 22 y.o. male presenting for 2-week history of bilateral ear fullness, tinnitus, sinus congestion, postnasal drainage.  Patient reports that his ears have become quite uncomfortable and feel like there wet.  Denies fever, sinus pain, throat pain, cough.  He is not currently taking any medications for his allergies.  Patient is currently smoking marijuana but denies smoking cigarettes.  Trevor Bates is not currently taking any medications.  Also is allergic to minocycline.  Trevor Bates  has a past medical history of Hyperlipidemia and Neuromuscular disorder (HCC).  Denies past surgical history.  Objective:   Vitals: BP 121/76   Pulse 61   Temp 98.3 F (36.8 C)   Resp 16   Ht 5\' 7"  (1.702 m)   Wt 174 lb 9.6 oz (79.2 kg)   SpO2 98%   BMI 27.35 kg/m   Physical Exam  Constitutional: He is oriented to person, place, and time. He appears well-developed and well-nourished.  HENT:  TMs opaque bilaterally with small effusion of the left ear.  Patient has mucosal edema, rhinorrhea, boggy and violaceous mucosa.  Patient has thick streaks of postnasal drainage and cobblestone pattern.  Eyes: Right eye exhibits no discharge. Left eye exhibits no discharge. No scleral icterus.  Neck: Normal range of motion. Neck supple.  Cardiovascular: Normal rate.  Pulmonary/Chest: Effort normal.  Lymphadenopathy:    He has no cervical adenopathy.  Neurological: He is alert and oriented to person, place, and time.  Skin: Skin is warm and dry.  Psychiatric: He has a normal mood and affect.   Assessment and Plan :   Allergic rhinitis due to other allergic trigger, unspecified seasonality  Eustachian tube dysfunction, bilateral  Counseled patient on diagnosis of allergic rhinitis and ETD.  He is to start Zyrtec and Flonase.  Use pseudoephedrine as needed as a decongestant.  Return to clinic precautions reviewed.  Trevor BambergMario Abbigaile Rockman, PA-C Primary Care at  [facility] [phone] 12/22/2017  4:08 PM

## 2018-01-19 ENCOUNTER — Encounter: Payer: Self-pay | Admitting: Family Medicine

## 2018-01-19 ENCOUNTER — Ambulatory Visit: Payer: BLUE CROSS/BLUE SHIELD | Admitting: Family Medicine

## 2018-01-19 ENCOUNTER — Other Ambulatory Visit: Payer: Self-pay

## 2018-01-19 VITALS — BP 126/82 | HR 58 | Temp 98.7°F | Ht 67.0 in | Wt 168.6 lb

## 2018-01-19 DIAGNOSIS — M222X1 Patellofemoral disorders, right knee: Secondary | ICD-10-CM | POA: Diagnosis not present

## 2018-01-19 DIAGNOSIS — M25561 Pain in right knee: Secondary | ICD-10-CM | POA: Diagnosis not present

## 2018-01-19 NOTE — Progress Notes (Signed)
8/23/20193:16 PM  Trevor Bates [birth year], 22 y.o. male [id number]  Chief Complaint  Patient presents with  . Knee Pain    right knee pain for the past 3 days. Ussing rest elevation and ice for comfort. Denies falls or any known injury. 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 Does alot of stairs    HPI:   Patient is a 22 y.o. male who presents today for right knee pain   Past 2 days right knee pain  dull pain feels inside his knee Going up and down stairs make it worse Tends to do stairs on his tiptoes Yesterday it gave out on him causing him to fall Has had issues with right knee, but no trauma or surgeries Was told several years that he had [employer] Does not exercise Has been resting and icing, no meds Compared to yesterday much better but still concerned as he still gets pain when he bends it No recent injuries No swelling or redness There was mild warmth No other joints involved  Fall Risk  01/19/2018 12/22/2017 04/04/2017 01/16/2017 12/26/2016  Falls in the past year? No No No No No  Number falls in past yr: - - - - -  Injury with Fall? - - - - -  Comment - - - - -     Depression screen [facility] 2/9 01/19/2018 12/22/2017 04/04/2017  Decreased Interest 0 0 0  Down, Depressed, Hopeless 0 0 0  PHQ - 2 Score 0 0 0    Allergies  Allergen Reactions  . Minocycline Hives and Rash    Prior to Admission medications   Medication Sig Start Date End Date Taking? Authorizing Provider  cetirizine (ZYRTEC) 10 MG tablet Take 1 tablet (10 mg total) by mouth daily. 12/22/17   Wallis Bamberg, PA-C  fluticasone (FLONASE) 50 MCG/ACT nasal spray Place 2 sprays into both nostrils daily. 12/22/17   Wallis Bamberg, PA-C  pseudoephedrine (SUDAFED 12 HOUR) 120 MG 12 hr tablet Take 1 tablet (120 mg total) by mouth 2 (two) times daily. 12/22/17   Wallis Bamberg, PA-C    Past Medical History:  Diagnosis Date  . Hyperlipidemia    told by MD before he had some high blood pressure - no work up or  treatment  . Neuromuscular disorder (HCC)     History reviewed. No pertinent surgical history.  Social History   Tobacco Use  . Smoking status: Never Smoker  . Smokeless tobacco: Never Used  Substance Use Topics  . Alcohol use: No    Alcohol/week: 0.0 standard drinks    Family History  Problem Relation Age of Onset  . Hyperlipidemia Mother   . Thyroid disease Maternal Aunt   . Heart attack Maternal Grandmother   . Sudden death Neg Hx     ROS Per hpi  OBJECTIVE:  Blood pressure 126/82, pulse (!) 58, temperature 98.7 F (37.1 C), temperature source Oral, height 5\' 7"  (1.702 m), weight 168 lb 9.6 oz (76.5 kg), SpO2 95 %. Body mass index is 26.41 kg/m.   Physical Exam  Constitutional: He is oriented to person, place, and time. He appears well-developed and well-nourished.  HENT:  Head: Normocephalic and atraumatic.  Eyes: Pupils are equal, round, and reactive to light. Conjunctivae and EOM are normal.  Neck: Neck supple.  Pulmonary/Chest: Effort normal.  Musculoskeletal:       Right knee: He exhibits normal range of motion, no swelling, no erythema, no LCL laxity, no bony tenderness, normal meniscus  and no MCL laxity. No patellar tendon tenderness noted.       Left knee: Normal.  + patellar grind test on the right  Neurological: He is alert and oriented to person, place, and time.  Skin: Skin is warm and dry.  Psychiatric: He has a normal mood and affect.  Nursing note and vitals reviewed.    ASSESSMENT and PLAN  1. Acute pain of right knee 2. Patellofemoral pain syndrome of right knee Discussed supportive measures, RICE therapy, and RTC precautions. Patient educational handout with exercises given.  Return if symptoms worsen or fail to improve.    Myles LippsIrma M Santiago, MD [facility] [address] [place], [place] [address] Ph.  [phone] Fax [phone]

## 2018-01-19 NOTE — Patient Instructions (Signed)
° ° ° °  If you have lab work done today you will be contacted with your lab results within the next 2 weeks.  If you have not heard from us then please contact us. The fastest way to get your results is to register for My Chart. ° ° °IF you received an x-ray today, you will receive an invoice from [facility]. Please contact [facility] at [phone] with questions or concerns regarding your invoice.  ° °IF you received labwork today, you will receive an invoice from [facility]. Please contact [facility] at [phone] with questions or concerns regarding your invoice.  ° °Our billing staff will not be able to assist you with questions regarding bills from these companies. ° °You will be contacted with the lab results as soon as they are available. The fastest way to get your results is to activate your My Chart account. Instructions are located on the last page of this paperwork. If you have not heard from us regarding the results in 2 weeks, please contact this office. °  ° ° ° °

## 2020-03-31 DIAGNOSIS — Z20822 Contact with and (suspected) exposure to covid-19: Secondary | ICD-10-CM | POA: Diagnosis not present

## 2020-04-07 DIAGNOSIS — J0100 Acute maxillary sinusitis, unspecified: Secondary | ICD-10-CM | POA: Diagnosis not present

## 2020-10-24 DIAGNOSIS — Z20822 Contact with and (suspected) exposure to covid-19: Secondary | ICD-10-CM | POA: Diagnosis not present

## 2022-02-22 ENCOUNTER — Ambulatory Visit: Payer: BC Managed Care – PPO | Admitting: Family Medicine

## 2022-02-22 ENCOUNTER — Encounter: Payer: Self-pay | Admitting: Family Medicine

## 2022-02-22 VITALS — BP 104/62 | HR 75 | Temp 98.0°F | Ht 67.0 in | Wt 186.5 lb

## 2022-02-22 DIAGNOSIS — Z0000 Encounter for general adult medical examination without abnormal findings: Secondary | ICD-10-CM | POA: Diagnosis not present

## 2022-02-22 DIAGNOSIS — R0981 Nasal congestion: Secondary | ICD-10-CM | POA: Diagnosis not present

## 2022-02-22 DIAGNOSIS — Z23 Encounter for immunization: Secondary | ICD-10-CM | POA: Diagnosis not present

## 2022-02-22 DIAGNOSIS — Z114 Encounter for screening for human immunodeficiency virus [HIV]: Secondary | ICD-10-CM

## 2022-02-22 DIAGNOSIS — G4700 Insomnia, unspecified: Secondary | ICD-10-CM | POA: Diagnosis not present

## 2022-02-22 DIAGNOSIS — M533 Sacrococcygeal disorders, not elsewhere classified: Secondary | ICD-10-CM

## 2022-02-22 DIAGNOSIS — Z1159 Encounter for screening for other viral diseases: Secondary | ICD-10-CM

## 2022-02-22 LAB — COMPREHENSIVE METABOLIC PANEL
ALT: 27 U/L (ref 0–53)
AST: 16 U/L (ref 0–37)
Albumin: 4.6 g/dL (ref 3.5–5.2)
Alkaline Phosphatase: 54 U/L (ref 39–117)
BUN: 17 mg/dL (ref 6–23)
CO2: 28 mEq/L (ref 19–32)
Calcium: 9.3 mg/dL (ref 8.4–10.5)
Chloride: 104 mEq/L (ref 96–112)
Creatinine, Ser: 1.1 mg/dL (ref 0.40–1.50)
GFR: 91.46 mL/min (ref >60.00)
Glucose, Bld: 91 mg/dL (ref 70–99)
Potassium: 4.3 mEq/L (ref 3.5–5.1)
Sodium: 138 mEq/L (ref 135–145)
Total Bilirubin: 0.8 mg/dL (ref 0.2–1.2)
Total Protein: 7.4 g/dL (ref 6.0–8.3)

## 2022-02-22 LAB — CBC
HCT: 44.2 % (ref 39.0–52.0)
Hemoglobin: 15 g/dL (ref 13.0–17.0)
MCHC: 34 g/dL (ref 30.0–36.0)
MCV: 87 fl (ref 78.0–100.0)
Platelets: 253 10*3/uL (ref 150.0–400.0)
RBC: 5.08 Mil/uL (ref 4.22–5.81)
RDW: 12.7 % (ref 11.5–15.5)
WBC: 7.7 10*3/uL (ref 4.0–10.5)

## 2022-02-22 LAB — LIPID PANEL
Cholesterol: 194 mg/dL (ref 0–200)
HDL: 49.2 mg/dL (ref >39.00)
LDL Cholesterol: 130 mg/dL — ABNORMAL HIGH (ref 0–99)
NonHDL: 144.42
Total CHOL/HDL Ratio: 4
Triglycerides: 71 mg/dL (ref 0.0–149.0)
VLDL: 14.2 mg/dL (ref 0.0–40.0)

## 2022-02-22 MED ORDER — FLUTICASONE PROPIONATE 50 MCG/ACT NA SUSP: 2.0000 | Freq: Every day | NASAL | 11 refills | Status: AC

## 2022-02-22 MED ORDER — TRAZODONE HCL 50 MG PO TABS: 25.0000 mg | ORAL_TABLET | Freq: Every evening | ORAL | 3 refills | Status: AC | PRN

## 2022-02-22 NOTE — Progress Notes (Signed)
Chief Complaint  Patient presents with   New Patient (Initial Visit)    Would like a physical Insomnia Spot (black dot) on bottom of toe     Well Male Trevor Bates is here for a complete physical.   His last physical was >1 year ago.  Current diet: in general, a "healthy" diet.   Current exercise: lifting wts Weight trend: up a little Fatigue out of ordinary? No. Seat belt? Yes.    Health maintenance Tetanus- Yes HIV- No Hep C- No  Insomnia Has had difficulty sleeping since childhood. Takes THC and melatonin which does help. Has trouble both falling asleep and staying asleep. He other self medication. Does not feel well rested. Has never tried any rx medication. Not following w a therapist. Does describe himself as anxious. No EtOH or napping. Takes caffeine pills in AM. Not much after noon.   Skin lesion L pinky toe on the bottom, no pain/itching/drainage. No new topicals. Has not tried anything thus far. Unsure if growing, noticed 2 d ago.   Patient has a history of pain over his tailbone that initially happened when he was squatting a few years ago.  He fell on his back while ice-skating and reinjured the area.  It hurts when he does back extensions at the gym.  No redness, bruising, or swelling.  No neurologic signs or symptoms.  Past Medical History:  Diagnosis Date   No pertinent past medical history      Past Surgical History:  Procedure Laterality Date   NO PAST SURGERIES     Medications  Takes no meds routinely.    Allergies Allergies  Allergen Reactions   Minocycline Hives and Rash    Family History Family History  Problem Relation Age of Onset   Hyperlipidemia Mother    Heart attack Maternal Grandmother    Breast cancer Maternal Grandmother    Thyroid disease Maternal Aunt    Sudden death Neg Hx    Colon cancer Neg Hx    Prostate cancer Neg Hx     Review of Systems: Constitutional: no fevers or chills Eye:  no recent significant change in  vision Ear/Nose/Mouth/Throat:  Ears:  no hearing loss Nose/Mouth/Throat:  no complaints of nasal congestion, no sore throat Cardiovascular:  no chest pain Respiratory:  no shortness of breath Gastrointestinal:  no abdominal pain, no change in bowel habits GU:  Male: negative for dysuria Musculoskeletal/Extremities:  +tailbone pain and L knee pain Integumentary (Skin/Breast): +black spot on L pinky toe Neurologic:  no headaches Endocrine: No unexpected weight changes Hematologic/Lymphatic:  no night sweats  Exam BP 104/62   Pulse 75   Temp 98 F (36.7 C) (Oral)   Ht 5\' 7"  (1.702 m)   Wt 186 lb 8 oz (84.6 kg)   SpO2 98%   BMI 29.21 kg/m  General:  well developed, well nourished, in no apparent distress Skin: Small area of hyperpigmentation over the plantar surface of the fifth digit pad; it looks like dirt; no significant moles, warts, or growths Head:  no masses, lesions, or tenderness Eyes:  pupils equal and round, sclera anicteric without injection Ears:  canals without lesions, TMs shiny without retraction, no obvious effusion, no erythema Nose:  nares patent, mucosa normal Throat/Pharynx:  lips and gingiva without lesion; tongue and uvula midline; non-inflamed pharynx; no exudates or postnasal drainage Neck: neck supple without adenopathy, thyromegaly, or masses Lungs:  clear to auscultation, breath sounds equal bilaterally, no respiratory distress Cardio:  regular rate  and rhythm, no bruits, no Hoback edema Abdomen:  abdomen soft, nontender; bowel sounds normal; no masses or organomegaly Genital (male): Deferred Rectal: Deferred Musculoskeletal:  symmetrical muscle groups noted without atrophy or deformity Extremities:  no clubbing, cyanosis, or edema, no deformities, no skin discoloration Neuro:  gait normal; deep tendon reflexes normal and symmetric Psych: well oriented with normal range of affect and appropriate judgment/insight  Assessment and Plan  Well adult exam -  Plan: CBC, Comprehensive metabolic panel, Lipid panel  Screening for HIV without presence of risk factors - Plan: HIV Antibody (routine testing w rflx)  Encounter for hepatitis C screening test for low risk patient - Plan: Hepatitis C antibody  Insomnia, unspecified type - Plan: traZODone (DESYREL) 50 MG tablet  Nasal congestion - Plan: fluticasone (FLONASE) 50 MCG/ACT nasal spray  Sacral pain - Plan: Ambulatory referral to Physical Therapy  Need for influenza vaccination - Plan: Flu Vaccine QUAD 6+ mos PF IM (Fluarix Quad PF)   Well 26 y.o. male. Counseled on diet and exercise. Self testicular exams recommended at least monthly.  Other orders as above. Insomnia: Chronic, uncontrolled.  Sleep hygiene information provided, CBT information provided, start trazodone 50 mg nightly as needed.  Follow-up in 1 month to recheck this. Sacral pain: Avoid aggravating activities.  Refer to physical therapy for their opinion. For the skin lesion on his foot, could be ecchymosis versus staining of the epidermis.  If this does not improve in the next month, he will let me know and we will get him set up with the dermatology team. Flu shot today. The patient voiced understanding and agreement to the plan.  [place], DO 02/22/22 11:31 AM

## 2022-02-22 NOTE — Patient Instructions (Addendum)
Give [place] 2-3 business days to get the results of your labs back.   Keep the diet clean and stay active.  Do monthly self testicular checks in the shower. You are feeling for lumps/bumps that don't belong. If you feel anything like this, let me know!  Please get me a copy of your advanced directive form at your convenience.   If you still having the lesion on your toe in 1 month, let me know.   If you do not hear anything about your referral in the next 1-2 weeks, call our office and ask for an update.  Sleep Hygiene Tips: Do not watch TV or look at screens within 1 hour of going to bed. If you do, make sure there is a blue light filter (nighttime mode) involved. Try to go to bed around the same time every night. Wake up at the same time within 1 hour of regular time. Ex: If you wake up at 7 AM for work, do not sleep past 8 AM on days that you don't work. Do not drink alcohol before bedtime. Do not consume caffeine-containing beverages after noon or within 9 hours of intended bedtime. Get regular exercise/physical activity in your life, but not within 2 hours of planned bedtime. Do not take naps.  Do not eat within 2 hours of planned bedtime. Melatonin, 3-5 mg 30-60 minutes before planned bedtime may be helpful.  The bed should be for sleep or sex only. If after 20-30 minutes you are unable to fall asleep, get up and do something relaxing. Do this until you feel ready to go to sleep again.   Sleep is important to [place] all. Getting good sleep is imperative to adequate functioning during the day. Work with our counselors who are trained to help people obtain quality sleep. Call [phone] to schedule an appointment or if you are curious about insurance coverage/cost.  Let us know if you need anything.

## 2022-02-23 LAB — HEPATITIS C ANTIBODY: Hepatitis C Ab: NONREACTIVE

## 2022-02-23 LAB — HIV ANTIBODY (ROUTINE TESTING W REFLEX): HIV 1&2 Ab, 4th Generation: NONREACTIVE

## 2022-03-09 ENCOUNTER — Encounter: Payer: Self-pay | Admitting: Physical Therapy

## 2022-03-09 ENCOUNTER — Ambulatory Visit: Payer: BC Managed Care – PPO | Attending: Family Medicine | Admitting: Physical Therapy

## 2022-03-09 DIAGNOSIS — R293 Abnormal posture: Secondary | ICD-10-CM | POA: Diagnosis not present

## 2022-03-09 DIAGNOSIS — M5459 Other low back pain: Secondary | ICD-10-CM | POA: Insufficient documentation

## 2022-03-09 DIAGNOSIS — R278 Other lack of coordination: Secondary | ICD-10-CM | POA: Diagnosis not present

## 2022-03-09 DIAGNOSIS — M533 Sacrococcygeal disorders, not elsewhere classified: Secondary | ICD-10-CM | POA: Diagnosis not present

## 2022-03-09 DIAGNOSIS — M6281 Muscle weakness (generalized): Secondary | ICD-10-CM | POA: Diagnosis not present

## 2022-03-09 NOTE — Therapy (Signed)
OUTPATIENT PHYSICAL THERAPY THORACOLUMBAR EVALUATION   Patient Name: Trevor Bates MRN: [id number] DOB:1995-07-22, 26 y.o., male Today's Date: 03/09/2022   PT End of Session - 03/09/22 1445     Visit Number 1    Date for PT Re-Evaluation 05/18/22    PT Start Time 1233    PT Stop Time 1310    PT Time Calculation (min) 37 min    Activity Tolerance Patient tolerated treatment well    Behavior During Therapy [facility] for tasks assessed/performed             Past Medical History:  Diagnosis Date   No pertinent past medical history    Past Surgical History:  Procedure Laterality Date   NO PAST SURGERIES     Patient Active Problem List   Diagnosis Date Noted   Unspecified vitamin D deficiency 10/20/2013   Allergic rhinitis 09/08/2013   Insomnia 09/08/2013   Anxiety state 09/08/2013   Snores 09/08/2013   Elevated blood pressure 07/13/2011    PCP: Shelda Pal  REFERRING PROVIDER: Shelda Pal,  REFERRING DIAG: DO M53.3 (ICD-10-CM) - Sacral pain   Rationale for Evaluation and Treatment Rehabilitation  THERAPY DIAG:  Abnormal posture  Muscle weakness (generalized)  Other lack of coordination  Other low back pain  ONSET DATE: 02/22/2022   SUBJECTIVE:                                                                                                                                                                                           SUBJECTIVE STATEMENT: Patient reports that he has had low back pain since he injured his tailbone 5 years ago performing squats. In May he had a fall onto his buttocks while ice skating. The pain has exacerbated now. He often feels like his back gives way with pain and no control. Lasts about 2 seconds, pain continues for about another minute, then it resolves.  PERTINENT HISTORY:  Patient has a history of pain over his tailbone that initially happened when he was squatting a few years ago. He fell on his back while  ice-skating and reinjured the area. It hurts when he does back extensions at the gym. No redness, bruising, or swelling. No neurologic signs or symptoms.   PAIN:  Are you having pain? Yes: NPRS scale: 8/10 Pain location: low back Pain description: stabbing Aggravating factors: Sit to stand Relieving factors: Attempting to improve his posture.   PRECAUTIONS: None  WEIGHT BEARING RESTRICTIONS No  FALLS:  Has patient fallen in last 6 months? No  LIVING ENVIRONMENT: Lives with: lives with their family Lives in: House/apartment Stairs: Yes: Internal:  14 steps; on right going up, Leans against the wall for safety Has following equipment at home: None  OCCUPATION: Data analyst- sits all day. Increased difficulty after sitting. Disc golf  PLOF: Independent  PATIENT GOALS : Identify how to control back pain. Improve flexibility, especially back.   OBJECTIVE:   DIAGNOSTIC FINDINGS:  N/A  SCREENING FOR RED FLAGS: Bowel or bladder incontinence: No Spinal tumors: No Cauda equina syndrome: No Compression fracture: No Abdominal aneurysm: No  COGNITION:  Overall cognitive status: Within functional limits for tasks assessed     SENSATION: Not tested  MUSCLE LENGTH: Hamstrings: Right 71 deg; Left 58 deg Thomas test: Right WFL deg; Left WFL deg  POSTURE: decreased lumbar lordosis and decreased thoracic kyphosis  PALPATION: TTP over sacrum, reports knots oin his medial scapular borders.  LUMBAR ROM:   Active  A/PROM  eval  Flexion 3" above toes  Extension WNL  Right lateral flexion To knee  Left lateral flexion To knee  Right rotation WNL  Left rotation WNL   (Blank rows = not tested)  LOWER EXTREMITY ROM:   All other ROM in BLE WNL  Passive  Right eval Left eval  Hip flexion Mildly limited Mildly limited  Hip extension    Hip abduction    Hip adduction    Hip internal rotation Mildly limited Mildly limited  Hip external rotation    Knee flexion    Knee  extension    Ankle dorsiflexion    Ankle plantarflexion    Ankle inversion    Ankle eversion     (Blank rows = not tested)  LOWER EXTREMITY MMT:    MMT Right eval Left eval  Hip flexion 4- 4+  Hip extension  4+  Hip abduction 4- 4+  Hip adduction    Hip internal rotation    Hip external rotation    Knee flexion 4- 4+  Knee extension 4- 4+  Ankle dorsiflexion 4 4+  Ankle plantarflexion    Ankle inversion    Ankle eversion     (Blank rows = not tested)  LUMBAR SPECIAL TESTS:  Slump test: Negative   S-I SCREEN-Negaitve  GAIT: Distance walked: clinic distances Assistive device utilized: None Level of assistance: Complete Independence Comments: No abnormalities noted.    TODAY'S TREATMENT  education   PATIENT EDUCATION:  Education details: POC Person educated: Patient Education method: Explanation Education comprehension: verbalized understanding   HOME EXERCISE PROGRAM: TBD  ASSESSMENT:  CLINICAL IMPRESSION: Patient is a 26 y.o. who was seen today for physical therapy evaluation and treatment for Chronic LBP with recent exacerbation. He reports falling onto his buttocks recently, aggravating an injury in low back sustained 5 years ago. He reports brief periods of sharp, severe pain and "giving" in his back, most often with sit to stand. He sits for long periods during the day, which is most likely contributing to his pain and instability. He tested neg for neurologic issues in low back as well as S-I issues. Pain is reported mostly at center of sacrum, with additional soreness in medial scapular borders. He does demonstrate some weakness and trunk instability. He will benefit from PT to address his deficits in order to improve his posture and trunk stability and decrease pain.    OBJECTIVE IMPAIRMENTS decreased coordination, decreased endurance, difficulty walking, decreased ROM, decreased strength, increased muscle spasms, impaired flexibility, improper body  mechanics, postural dysfunction, and pain.   ACTIVITY LIMITATIONS carrying, lifting, sitting, squatting, and locomotion level  PARTICIPATION LIMITATIONS: cleaning  and occupation  PERSONAL FACTORS Past/current experiences are also affecting patient's functional outcome.   REHAB POTENTIAL: Good  CLINICAL DECISION MAKING: Stable/uncomplicated  EVALUATION COMPLEXITY: Moderate   GOALS: Goals reviewed with patient? Yes  SHORT TERM GOALS: Target date: 04/06/2022  I with basic HEP Baseline: Goal status: INITIAL  LONG TERM GOALS: Target date: 05/18/2022  I with final HEP Baseline:  Goal status: INITIAL  2.  Paitent will report no more "giving" in low back during activities at home, x at least 4 weeks Baseline: Reports episodes at least once/week, sometimes more. Goal status: INITIAL  3.  Patient will tolerate a full day of work with back pain < 3/10 Baseline:  Goal status: INITIAL  4.  Patient will demonstrate normal thoracic mobility. Baseline: flattened thoracic curve Goal status: INITIAL  5.  Patient will report resolution of knots and muscle spasms along R medial scapular border. Baseline:  Goal status: INITIAL   PLAN: PT FREQUENCY: 1-2x/week  PT DURATION: 10 weeks  PLANNED INTERVENTIONS: Therapeutic exercises, Therapeutic activity, Neuromuscular re-education, Balance training, Gait training, Patient/Family education, Self Care, Joint mobilization, Dry Needling, Electrical stimulation, Cryotherapy, Moist heat, Ionotophoresis 4mg /ml Dexamethasone, and Manual therapy.  PLAN FOR NEXT SESSION: Initiate HEP   , DPT 03/09/2022, 3:50 PM

## 2022-03-22 ENCOUNTER — Encounter: Payer: Self-pay | Admitting: Physical Therapy

## 2022-03-22 ENCOUNTER — Ambulatory Visit: Payer: BC Managed Care – PPO | Admitting: Physical Therapy

## 2022-03-22 DIAGNOSIS — M5459 Other low back pain: Secondary | ICD-10-CM | POA: Diagnosis not present

## 2022-03-22 DIAGNOSIS — R278 Other lack of coordination: Secondary | ICD-10-CM | POA: Diagnosis not present

## 2022-03-22 DIAGNOSIS — M533 Sacrococcygeal disorders, not elsewhere classified: Secondary | ICD-10-CM | POA: Diagnosis not present

## 2022-03-22 DIAGNOSIS — R293 Abnormal posture: Secondary | ICD-10-CM | POA: Diagnosis not present

## 2022-03-22 DIAGNOSIS — M6281 Muscle weakness (generalized): Secondary | ICD-10-CM

## 2022-03-22 NOTE — Therapy (Signed)
OUTPATIENT PHYSICAL THERAPY THORACOLUMBAR EVALUATION   Patient Name: Trevor Bates MRN: [id number] DOB:1995/08/05, 26 y.o., male Today's Date: 03/22/2022   PT End of Session - 03/22/22 1235     Visit Number 2    Date for PT Re-Evaluation 05/18/22    PT Start Time 1231    PT Stop Time 1310    PT Time Calculation (min) 39 min    Activity Tolerance Patient tolerated treatment well    Behavior During Therapy [facility] for tasks assessed/performed              Past Medical History:  Diagnosis Date   No pertinent past medical history    Past Surgical History:  Procedure Laterality Date   NO PAST SURGERIES     Patient Active Problem List   Diagnosis Date Noted   Unspecified vitamin D deficiency 10/20/2013   Allergic rhinitis 09/08/2013   Insomnia 09/08/2013   Anxiety state 09/08/2013   Snores 09/08/2013   Elevated blood pressure 07/13/2011    PCP: Shelda Pal  REFERRING PROVIDER: Shelda Pal,  REFERRING DIAG: DO M53.3 (ICD-10-CM) - Sacral pain   Rationale for Evaluation and Treatment Rehabilitation  THERAPY DIAG:  Abnormal posture  Muscle weakness (generalized)  Other lack of coordination  Other low back pain  ONSET DATE: 02/22/2022   SUBJECTIVE:                                                                                                                                                                                           SUBJECTIVE STATEMENT: Patient reports that he tried a back massager yesterday and it may have aggravated his symptoms.  PERTINENT HISTORY:  Patient has a history of pain over his tailbone that initially happened when he was squatting a few years ago. He fell on his back while ice-skating and reinjured the area. It hurts when he does back extensions at the gym. No redness, bruising, or swelling. No neurologic signs or symptoms.   PAIN:  Are you having pain? Yes: NPRS scale: 8/10 Pain location: low back Pain  description: stabbing Aggravating factors: Sit to stand Relieving factors: Attempting to improve his posture.   PRECAUTIONS: None  WEIGHT BEARING RESTRICTIONS No  FALLS:  Has patient fallen in last 6 months? 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 Improve  flexibility, especially back.   OBJECTIVE:   DIAGNOSTIC FINDINGS:  N/A  SCREENING FOR RED FLAGS: Bowel or bladder incontinence: No Spinal tumors: No Cauda equina syndrome: No Compression fracture: No Abdominal aneurysm: No  COGNITION:  Overall cognitive status: Within functional limits for tasks assessed     SENSATION: Not tested  MUSCLE LENGTH: Hamstrings: Right 71 deg; Left 58 deg Thomas test: Right WFL deg; Left WFL deg  POSTURE: decreased lumbar lordosis and decreased thoracic kyphosis  PALPATION: TTP over sacrum, reports knots oin his medial scapular borders.  LUMBAR ROM:   Active  A/PROM  eval  Flexion 3" above toes  Extension WNL  Right lateral flexion To knee  Left lateral flexion To knee  Right rotation WNL  Left rotation WNL   (Blank rows = not tested)  LOWER EXTREMITY ROM:   All other ROM in BLE WNL  Passive  Right eval Left eval  Hip flexion Mildly limited Mildly limited  Hip extension    Hip abduction    Hip adduction    Hip internal rotation Mildly limited Mildly limited  Hip external rotation    Knee flexion    Knee extension    Ankle dorsiflexion    Ankle plantarflexion    Ankle inversion    Ankle eversion     (Blank rows = not tested)  LOWER EXTREMITY MMT:    MMT Right eval Left eval  Hip flexion 4- 4+  Hip extension  4+  Hip abduction 4-  4+  Hip adduction    Hip internal rotation    Hip external rotation    Knee flexion 4- 4+  Knee extension 4- 4+  Ankle dorsiflexion 4 4+  Ankle plantarflexion    Ankle inversion    Ankle eversion     (Blank rows = not tested)  LUMBAR SPECIAL TESTS:  Slump test: Negative   S-I SCREEN-Negaitve  GAIT: Distance walked: clinic distances Assistive device utilized: None Level of assistance: Complete Independence Comments: No abnormalities noted.    TODAY'S TREATMENT  03/22/22 NuStep L5 x 6 minutes U and Mazurkiewicz. Supine [place] stretch-HS, piriformis, ITB, gluts BLE Strengthening-Marching bridges, 2 x 10, Hip abd against G tband x 10, standing hip abd against G tband, no UE support 10 each.   education   PATIENT EDUCATION:  Education details: POC Person educated: Patient Education method: Explanation Education comprehension: verbalized understanding   HOME EXERCISE PROGRAM: [id number]  ASSESSMENT:  CLINICAL IMPRESSION: Patient reports minimal pain, but continues to have pain and unsteadiness when rising from his chair at work. His job requires full days of sitting. HEP updated to include stretching for lower body as well as trunk and LEW strengthening to improve stability. He performed all exercises without report of pain. Educated to attend to form. He would like to return to the gym after therapy ends.     OBJECTIVE IMPAIRMENTS decreased coordination, decreased endurance, difficulty walking, decreased ROM, decreased strength, increased muscle spasms, impaired flexibility, improper body mechanics, postural dysfunction, and pain.   ACTIVITY LIMITATIONS carrying, lifting, sitting, squatting, and locomotion level  PARTICIPATION LIMITATIONS: cleaning and occupation  PERSONAL FACTORS Past/current experiences are also affecting patient's functional outcome.   REHAB POTENTIAL: Good  CLINICAL DECISION MAKING: Stable/uncomplicated  EVALUATION COMPLEXITY: Moderate   GOALS: Goals  reviewed with patient? Yes  SHORT TERM GOALS: Target date: 04/06/2022  I with basic HEP Baseline: Goal status: ongoing  LONG TERM GOALS: Target date: 05/18/2022  I with final HEP Baseline:  Goal status: INITIAL  2.  Paitent will report no more "giving" in low back during activities at home, x at least 4 weeks Baseline: Reports episodes at least once/week, sometimes more. Goal status: INITIAL  3.  Patient will tolerate a full day of work with back pain < 3/10 Baseline:  Goal status: INITIAL  4.  Patient will demonstrate normal thoracic mobility. Baseline: flattened thoracic curve Goal status: ongoing  5.  Patient will report resolution of knots and muscle spasms along R medial scapular border. Baseline:  Goal status: INITIAL   PLAN: PT FREQUENCY: 1-2x/week  PT DURATION: 10 weeks  PLANNED INTERVENTIONS: Therapeutic exercises, Therapeutic activity, Neuromuscular re-education, Balance training, Gait training, Patient/Family education, Self Care, Joint mobilization, Dry Needling, Electrical stimulation, Cryotherapy, Moist heat, Ionotophoresis 4mg /ml Dexamethasone, and Manual therapy.  PLAN FOR NEXT SESSION: assess tolerance to HEP, update, assess muscles on medial scapular borders.   , DPT 03/22/2022, 2:12 PM

## 2022-03-23 ENCOUNTER — Encounter: Payer: Self-pay | Admitting: Family Medicine

## 2022-03-23 ENCOUNTER — Other Ambulatory Visit (HOSPITAL_COMMUNITY)
Admission: RE | Admit: 2022-03-23 | Discharge: 2022-03-23 | Disposition: A | Discharge status: Home / Self Care | Payer: BC Managed Care – PPO | Source: Ambulatory Visit | Attending: Family Medicine | Admitting: Family Medicine

## 2022-03-23 ENCOUNTER — Ambulatory Visit: Payer: BC Managed Care – PPO | Admitting: Family Medicine

## 2022-03-23 VITALS — BP 118/68 | HR 68 | Temp 98.3°F | Ht 67.0 in | Wt 189.4 lb

## 2022-03-23 DIAGNOSIS — L989 Disorder of the skin and subcutaneous tissue, unspecified: Secondary | ICD-10-CM

## 2022-03-23 DIAGNOSIS — Z202 Contact with and (suspected) exposure to infections with a predominantly sexual mode of transmission: Secondary | ICD-10-CM | POA: Diagnosis not present

## 2022-03-23 DIAGNOSIS — G4700 Insomnia, unspecified: Secondary | ICD-10-CM

## 2022-03-23 DIAGNOSIS — L299 Pruritus, unspecified: Secondary | ICD-10-CM

## 2022-03-23 DIAGNOSIS — Z113 Encounter for screening for infections with a predominantly sexual mode of transmission: Secondary | ICD-10-CM

## 2022-03-23 DIAGNOSIS — E663 Overweight: Secondary | ICD-10-CM

## 2022-03-23 MED ORDER — CLOTRIMAZOLE-BETAMETHASONE 1-0.05 % EX CREA: 1.0000 | TOPICAL_CREAM | Freq: Two times a day (BID) | CUTANEOUS | 0 refills | Status: DC

## 2022-03-23 NOTE — Progress Notes (Signed)
Chief Complaint  Patient presents with   Follow-up    Medication for sleep Check toe Discuss lab results Weight loss    Subjective: Patient is a 26 y.o. male here for f/u insomnia.  Patient was started on 25 mg of trazodone nightly.  He took half a tablet about some of the time.  He bumped up to a full tablet worked pretty well.  No adverse effects overall and he reports taking it intermittently.  The patient had an LDL of 130 one month ago.  His diet is poor overall and eats lots of red meat.  He is interested in losing weight and improving his cholesterol.  The darker spot on his right pinky toe is still present.  There has been no change but he is interested in seeing a specialist for either further management or reassurance.  He has had an itchy area in his genital region behind his testicles over the past month.  He has not tried anything at home.  There is no redness or drainage.  Sometimes after he scratches, bumps will form.  No new lotions, soaps, topicals, or detergents.  Past Medical History:  Diagnosis Date   No pertinent past medical history     Objective: BP 118/68 (BP Location: Left Arm, Patient Position: Sitting, Cuff Size: Normal)   Pulse 68   Temp 98.3 F (36.8 C) (Oral)   Ht 5\' 7"  (1.702 m)   Wt 189 lb 6 oz (85.9 kg)   SpO2 97%   BMI 29.66 kg/m  General: Awake, appears stated age Skin: I do not appreciate any erythema or papules in the genital region or posterior to the scrotum.  There is a circular hyperpigmented macule approximately 4 mm in diameter on the plantar surface of his right pinky toe.  There is no TTP, scaling, fluctuance, or drainage. Lungs: No accessory muscle use Psych: Age appropriate judgment and insight, normal affect and mood  Assessment and Plan: Insomnia, unspecified type  Skin lesion - Plan: Ambulatory referral to Dermatology  Pruritic condition - Plan: clotrimazole-betamethasone (LOTRISONE) cream  Overweight (BMI  25.0-29.9)  Screen for STD (sexually transmitted disease) - Plan: Urine cytology ancillary only([place])  Chronic, not fully controlled.  Increase dosage of trazodone to 50-100 mg nightly as needed.  He will let me know if he is still having issues in the next mo. Appears benign but will refer to dermatology at his request. We will send in Lotrisone to cover for dermatitis and also jock itch. Counseled on diet and exercise.  Healthy diet handout provided. Screen for trichomonas, gonorrhea, chlamydia. Follow-up in 5 months or as needed. The patient voiced understanding and agreement to the plan.  Mc[place], DO 03/23/22  12:07 PM

## 2022-03-23 NOTE — Patient Instructions (Addendum)
Let's try 1-2 tabs of trazodone nightly, send a message in a month if you aren't doing well.   Keep the diet clean and stay active.  If you do not hear anything about your referral in the next 1-2 weeks, call our office and ask for an update.  Let us know if you need anything.  Healthy Eating Plan Many factors influence your heart health, including eating and exercise habits. Heart (coronary) risk increases with abnormal blood fat (lipid) levels. Heart-healthy meal planning includes limiting unhealthy fats, increasing healthy fats, and making other small dietary changes. This includes maintaining a healthy body weight to help keep lipid levels within a normal range.  WHAT IS MY PLAN?  Your health care provider recommends that you: Drink a glass of water before meals to help with satiety. Eat slowly. An alternative to the water is to add Metamucil. This will help with satiety as well. It does contain calories, unlike water.  WHAT TYPES OF FAT SHOULD I CHOOSE? Choose healthy fats more often. Choose monounsaturated and polyunsaturated fats, such as olive oil and canola oil, flaxseeds, walnuts, almonds, and seeds. Eat more omega-3 fats. Good choices include salmon, mackerel, sardines, tuna, flaxseed oil, and ground flaxseeds. Aim to eat fish at least two times each week. Avoid foods with partially hydrogenated oils in them. These contain trans fats. Examples of foods that contain trans fats are stick margarine, some tub margarines, cookies, crackers, and other baked goods. If you are going to avoid a fat, this is the one to avoid!  WHAT GENERAL GUIDELINES DO I NEED TO FOLLOW? Check food labels carefully to identify foods with trans fats. Avoid these types of options when possible. Fill one half of your plate with vegetables and green salads. Eat 4-5 servings of vegetables per day. A serving of vegetables equals 1 cup of raw leafy vegetables,  cup of raw or cooked cut-up vegetables, or  cup of  vegetable juice. Fill one fourth of your plate with whole grains. Look for the word "whole" as the first word in the ingredient list. Fill one fourth of your plate with lean protein foods. Eat 4-5 servings of fruit per day. A serving of fruit equals one medium whole fruit,  cup of dried fruit,  cup of fresh, frozen, or canned fruit. Try to avoid fruits in cups/syrups as the sugar content can be high. Eat more foods that contain soluble fiber. Examples of foods that contain this type of fiber are apples, broccoli, carrots, beans, peas, and barley. Aim to get 20-30 g of fiber per day. Eat more home-cooked food and less restaurant, buffet, and fast food. Limit or avoid alcohol. Limit foods that are high in starch and sugar. Avoid fried foods when able. Cook foods by using methods other than frying. Baking, boiling, grilling, and broiling are all great options. Other fat-reducing suggestions include: Removing the skin from poultry. Removing all visible fats from meats. Skimming the fat off of stews, soups, and gravies before serving them. Steaming vegetables in water or broth. Lose weight if you are overweight. Losing just 5-10% of your initial body weight can help your overall health and prevent diseases such as diabetes and heart disease. Increase your consumption of nuts, legumes, and seeds to 4-5 servings per week. One serving of dried beans or legumes equals  cup after being cooked, one serving of nuts equals 1 ounces, and one serving of seeds equals  ounce or 1 tablespoon.  WHAT ARE GOOD FOODS CAN I EAT?  Grains Grainy breads (try to find bread that is 3 g of fiber per slice or greater), oatmeal, light popcorn. Whole-grain cereals. Rice and pasta, including brown rice and those that are made with whole wheat. Edamame pasta is a great alternative to grain pasta. It has a higher protein content. Try to avoid significant consumption of white bread, sugary cereals, or pastries/baked  goods.  Vegetables All vegetables. Cooked white potatoes do not count as vegetables.  Fruits All fruits, but limit pineapple and bananas as these fruits have a higher sugar content. Fruits where you consume the skin are healthier options.   Meats and Other Protein Sources Lean, well-trimmed beef, veal, pork, and lamb. Chicken and [place] without skin. All fish and shellfish. Wild duck, rabbit, pheasant, and venison. Egg whites or low-cholesterol egg substitutes. Dried beans, peas, lentils, and tofu. Seeds and most nuts.  Dairy Low-fat or nonfat cheeses, including ricotta, string, and mozzarella. Skim or 1% milk that is liquid, powdered, or evaporated. Buttermilk that is made with low-fat milk. Nonfat or low-fat yogurt. Soy/Almond milk are good alternatives if you cannot handle dairy.  Beverages Water is the best for you. Sports drinks with less sugar are more desirable unless you are a highly active athlete.  Sweets and Desserts Sherbets and fruit ices. Honey, jam, marmalade, jelly, and syrups. Dark chocolate.  Eat all sweets and desserts in moderation.  Fats and Oils Nonhydrogenated (trans-free) margarines. Vegetable oils, including soybean, sesame, sunflower, olive, peanut, safflower, corn, canola, and cottonseed. Salad dressings or mayonnaise that are made with a vegetable oil. Limit added fats and oils that you use for cooking, baking, salads, and as spreads.  Other Cocoa powder. Coffee and tea. Most condiments.  The items listed above may not be a complete list of recommended foods or beverages. Contact your dietitian for more options.

## 2022-03-24 ENCOUNTER — Ambulatory Visit: Payer: BC Managed Care – PPO | Admitting: Physical Therapy

## 2022-03-24 DIAGNOSIS — M533 Sacrococcygeal disorders, not elsewhere classified: Secondary | ICD-10-CM | POA: Diagnosis not present

## 2022-03-24 DIAGNOSIS — R278 Other lack of coordination: Secondary | ICD-10-CM | POA: Diagnosis not present

## 2022-03-24 DIAGNOSIS — M6281 Muscle weakness (generalized): Secondary | ICD-10-CM

## 2022-03-24 DIAGNOSIS — M5459 Other low back pain: Secondary | ICD-10-CM | POA: Diagnosis not present

## 2022-03-24 DIAGNOSIS — R293 Abnormal posture: Secondary | ICD-10-CM

## 2022-03-24 LAB — URINE CYTOLOGY ANCILLARY ONLY
Chlamydia: NEGATIVE
Comment: NEGATIVE
Comment: NEGATIVE
Comment: NORMAL
Neisseria Gonorrhea: NEGATIVE
Trichomonas: NEGATIVE

## 2022-03-24 NOTE — Therapy (Signed)
OUTPATIENT PHYSICAL THERAPY THORACOLUMBAR   Patient Name: Trevor Bates MRN: [id number] DOB:[birth year], 26 y.o., male Today's Date: 03/24/2022   PT End of Session - 03/24/22 1058     Visit Number 3    Date for PT Re-Evaluation 05/18/22    PT Start Time 1100    PT Stop Time [id number]    PT Time Calculation (min) 45 min              Past Medical History:  Diagnosis Date   No pertinent past medical history    Past Surgical History:  Procedure Laterality Date   NO PAST SURGERIES     Patient Active Problem List   Diagnosis Date Noted   Unspecified vitamin D deficiency 10/20/2013   Allergic rhinitis 09/08/2013   Insomnia 09/08/2013   Anxiety state 09/08/2013   Snores 09/08/2013   Elevated blood pressure 07/13/2011    PCP: Shelda Pal  REFERRING PROVIDER: Shelda Pal,  REFERRING DIAG: DO M53.3 (ICD-10-CM) - Sacral pain   Rationale for Evaluation and Treatment Rehabilitation  THERAPY DIAG:  Abnormal posture  Muscle weakness (generalized)  ONSET DATE: 02/22/2022   SUBJECTIVE:                                                                                                                                                                                           SUBJECTIVE STATEMENT: NO pain currently but when it hurts it hurts  PERTINENT HISTORY:  Patient has a history of pain over his tailbone that initially happened when he was squatting a few years ago. He fell on his back while ice-skating and reinjured the area. It hurts when he does back extensions at the gym. No redness, bruising, or swelling. No neurologic signs or symptoms.   PAIN:  Are you having pain? No   PRECAUTIONS: None  WEIGHT BEARING RESTRICTIONS No  FALLS:  Has patient fallen in last 6 months? 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 Improve flexibility, especially back.   OBJECTIVE:   DIAGNOSTIC FINDINGS:  N/A  SCREENING FOR RED FLAGS: Bowel or bladder incontinence: No Spinal tumors: No Cauda equina syndrome: No Compression fracture: No Abdominal aneurysm: No  COGNITION:  Overall cognitive status: Within functional limits for tasks assessed     SENSATION: Not tested  MUSCLE LENGTH: Hamstrings: Right  71 deg; Left 58 deg Thomas test: Right WFL deg; Left WFL deg  POSTURE: decreased lumbar lordosis and decreased thoracic kyphosis  PALPATION: TTP over sacrum, reports knots oin his medial scapular borders.  LUMBAR ROM:   Active  A/PROM  eval  Flexion 3" above toes  Extension WNL  Right lateral flexion To knee  Left lateral flexion To knee  Right rotation WNL  Left rotation WNL   (Blank rows = not tested)  LOWER EXTREMITY ROM:   All other ROM in BLE WNL  Passive  Right eval Left eval  Hip flexion Mildly limited Mildly limited  Hip extension    Hip abduction    Hip adduction    Hip internal rotation Mildly limited Mildly limited  Hip external rotation    Knee flexion    Knee extension    Ankle dorsiflexion    Ankle plantarflexion    Ankle inversion    Ankle eversion     (Blank rows = not tested)  LOWER EXTREMITY MMT:    MMT Right eval Left eval  Hip flexion 4- 4+  Hip extension  4+  Hip abduction 4- 4+  Hip adduction    Hip internal rotation    Hip external rotation    Knee flexion 4- 4+  Knee extension 4- 4+  Ankle dorsiflexion 4 4+  Ankle plantarflexion    Ankle inversion    Ankle eversion     (Blank rows = not tested)  LUMBAR SPECIAL TESTS:  Slump test: Negative   S-I SCREEN-Negaitve  GAIT: Distance walked: clinic distances Assistive device utilized: None Level of  assistance: Complete Independence Comments: No abnormalities noted.    TODAY'S TREATMENT   03/24/22  Elliptical 2 min fwd/2 min back R 3 Feet on ball bridge 10 x hold 3 sec, SL bridge 2x5  each, HS curl with butt lift 10 x Prone ball rolling out 10 x then 5 x each side Prone wt ball ext 10x then 5 x each side Superman 10 x hold 5 sec Cable pulley shld ext 2 sets 10 15# Cable pulley row with squat 2 sets 10  20# Wt ball OH trunk ext and rotation 15 x each (BLUE ball) Green tband hip ext alt 20 x then abd 20 x Educ on good sittng posture and desk ergonomics    03/22/22 NuStep L5 x 6 minutes U and Domke. Supine [place] stretch-HS, piriformis, ITB, gluts BLE Strengthening-Marching bridges, 2 x 10, Hip abd against G tband x 10, standing hip abd against G tband, no UE support 10 each.   education   PATIENT EDUCATION:  Education details: POC Person educated: Patient Education method: Explanation Education comprehension: verbalized understanding   HOME EXERCISE PROGRAM: [id number]  ASSESSMENT:  CLINICAL IMPRESSION: Progressed core stab with cuing to engage muscles. No pain just fatigue and rest needed. Educ on sitting posture and desk ergonomics OBJECTIVE IMPAIRMENTS decreased coordination, decreased endurance, difficulty walking, decreased ROM, decreased strength, increased muscle spasms, impaired flexibility, improper body mechanics, postural dysfunction, and pain.   ACTIVITY LIMITATIONS carrying, lifting, sitting, squatting, and locomotion level  PARTICIPATION LIMITATIONS: cleaning and occupation  PERSONAL FACTORS Past/current experiences are also affecting patient's functional outcome.   REHAB POTENTIAL: Good  CLINICAL DECISION MAKING: Stable/uncomplicated  EVALUATION COMPLEXITY: Moderate   GOALS: Goals reviewed with patient? Yes  SHORT TERM GOALS: Target date: 04/06/2022  I with basic HEP Baseline: Goal status: ongoing  LONG TERM GOALS: Target date:  05/18/2022  I with final HEP Baseline:  Goal  status: INITIAL  2.  Paitent will report no more "giving" in low back during activities at home, x at least 4 weeks Baseline: Reports episodes at least once/week, sometimes more. Goal status: INITIAL  3.  Patient will tolerate a full day of work with back pain < 3/10 Baseline:  Goal status: INITIAL  4.  Patient will demonstrate normal thoracic mobility. Baseline: flattened thoracic curve Goal status: ongoing  5.  Patient will report resolution of knots and muscle spasms along R medial scapular border. Baseline:  Goal status: INITIAL   PLAN: PT FREQUENCY: 1-2x/week  PT DURATION: 10 weeks  PLANNED INTERVENTIONS: Therapeutic exercises, Therapeutic activity, Neuromuscular re-education, Balance training, Gait training, Patient/Family education, Self Care, Joint mobilization, Dry Needling, Electrical stimulation, Cryotherapy, Moist heat, Ionotophoresis 4mg /ml Dexamethasone, and Manual therapy.  PLAN FOR NEXT SESSION: assess tolerance to HEP and update, assess muscles on medial scapular borders.  Levada Dy Countess Biebel PTA 03/24/2022, 10:59 AM [place]. [place], [place], [address] Phone: [phone]   Fax:  [phone]  Patient Details  Name: Trevor Bates MRN: [id number] Date of Birth: 04-Nov-1995 Referring Provider:  Shelda Pal*  Encounter Date: 03/24/2022   Laqueta Carina, PTA 03/24/2022, 10:59 AM  [place] [place], [place], [address] Phone: [phone]   Fax:  [phone]

## 2022-03-29 ENCOUNTER — Ambulatory Visit: Payer: BC Managed Care – PPO

## 2022-03-31 ENCOUNTER — Ambulatory Visit: Payer: BC Managed Care – PPO | Admitting: Physical Therapy

## 2022-04-05 ENCOUNTER — Ambulatory Visit: Payer: BC Managed Care – PPO | Admitting: Physical Therapy

## 2022-04-07 ENCOUNTER — Ambulatory Visit: Payer: BC Managed Care – PPO | Admitting: Physical Therapy

## 2022-04-12 ENCOUNTER — Ambulatory Visit: Payer: BC Managed Care – PPO

## 2022-04-14 ENCOUNTER — Ambulatory Visit: Payer: BC Managed Care – PPO | Admitting: Physical Therapy

## 2022-08-12 DIAGNOSIS — L648 Other androgenic alopecia: Secondary | ICD-10-CM | POA: Diagnosis not present

## 2022-08-12 DIAGNOSIS — D2272 Melanocytic nevi of left lower limb, including hip: Secondary | ICD-10-CM | POA: Diagnosis not present

## 2022-08-12 DIAGNOSIS — L308 Other specified dermatitis: Secondary | ICD-10-CM | POA: Diagnosis not present

## 2022-12-09 DIAGNOSIS — R519 Headache, unspecified: Secondary | ICD-10-CM | POA: Diagnosis not present

## 2022-12-09 DIAGNOSIS — R030 Elevated blood-pressure reading, without diagnosis of hypertension: Secondary | ICD-10-CM | POA: Diagnosis not present

## 2022-12-09 DIAGNOSIS — R0602 Shortness of breath: Secondary | ICD-10-CM | POA: Diagnosis not present

## 2022-12-09 DIAGNOSIS — M791 Myalgia, unspecified site: Secondary | ICD-10-CM | POA: Diagnosis not present

## 2022-12-09 DIAGNOSIS — J00 Acute nasopharyngitis [common cold]: Secondary | ICD-10-CM | POA: Diagnosis not present

## 2022-12-09 DIAGNOSIS — R0789 Other chest pain: Secondary | ICD-10-CM | POA: Diagnosis not present

## 2022-12-21 ENCOUNTER — Encounter: Payer: Self-pay | Admitting: Emergency Medicine

## 2022-12-21 ENCOUNTER — Other Ambulatory Visit: Payer: Self-pay

## 2022-12-21 ENCOUNTER — Ambulatory Visit: Payer: BC Managed Care – PPO

## 2022-12-21 ENCOUNTER — Ambulatory Visit
Admission: EM | Admit: 2022-12-21 | Discharge: 2022-12-21 | Disposition: A | Discharge status: Home / Self Care | Payer: BC Managed Care – PPO | Attending: Family Medicine | Admitting: Family Medicine

## 2022-12-21 DIAGNOSIS — M25572 Pain in left ankle and joints of left foot: Secondary | ICD-10-CM | POA: Diagnosis not present

## 2022-12-21 DIAGNOSIS — S93602A Unspecified sprain of left foot, initial encounter: Secondary | ICD-10-CM | POA: Diagnosis not present

## 2022-12-21 DIAGNOSIS — M79672 Pain in left foot: Secondary | ICD-10-CM | POA: Diagnosis not present

## 2022-12-21 MED ORDER — IBUPROFEN 800 MG PO TABS: 800.0000 mg | ORAL_TABLET | Freq: Three times a day (TID) | ORAL | 0 refills | Status: DC

## 2022-12-21 NOTE — ED Triage Notes (Signed)
Left foot pain x 2 days, pain worse when walking. Carried a boxed mattress and lifted it with his left foot.

## 2022-12-21 NOTE — ED Provider Notes (Signed)
[facility]    CSN: [id number] Arrival date & time: 12/21/22  0913      History   Chief Complaint Chief Complaint  Patient presents with   Foot Pain    left    HPI Trevor Bates is a 27 y.o. male.   HPI  Patient states that he injured his left foot 2 days ago.  He was trying to move a heavy object, weighing over 80 pounds, and he struck his left foot underneath that and tried to lift up the box with his foot.  Felt pain immediately.  Pain is getting worse since then.  He has pain all across the top of his foot points to the metatarsals. No past history of foot problems or gout  Past Medical History:  Diagnosis Date   No pertinent past medical history     Patient Active Problem List   Diagnosis Date Noted   Unspecified vitamin D deficiency 10/20/2013   Allergic rhinitis 09/08/2013   Insomnia 09/08/2013   Anxiety state 09/08/2013   Snores 09/08/2013   Elevated blood pressure 07/13/2011    Past Surgical History:  Procedure Laterality Date   NO PAST SURGERIES         Home Medications    Prior to Admission medications   Medication Sig Start Date End Date Taking? Authorizing Provider  ibuprofen (ADVIL) 800 MG tablet Take 1 tablet (800 mg total) by mouth 3 (three) times daily. 12/21/22  Yes Eustace Moore, MD  fluticasone [facility]) 50 MCG/ACT nasal spray Place 2 sprays into both nostrils daily. 02/22/22   Sharlene Dory, DO  traZODone (DESYREL) 50 MG tablet Take 0.5-1 tablets (25-50 mg total) by mouth at bedtime as needed for sleep. 02/22/22   Sharlene Dory, DO    Family History Family History  Problem Relation Age of Onset   Hyperlipidemia Mother    Hyperlipidemia Father    Heart attack Maternal Grandmother    Breast cancer Maternal Grandmother    Thyroid disease Maternal Aunt    Sudden death Neg Hx    Colon cancer Neg Hx    Prostate cancer Neg Hx     Social History Social History   Tobacco Use   Smoking status: Never    Smokeless tobacco: Never  Vaping Use   Vaping status: Some Days  Substance Use Topics   Alcohol use: Yes   Drug use: Yes    Types: Marijuana     Allergies   Minocycline   Review of Systems Review of Systems  See HPI Physical Exam Triage Vital Signs ED Triage Vitals  Encounter Vitals Group     BP 12/21/22 0927 129/86     Systolic BP Percentile --      Diastolic BP Percentile --      Pulse Rate 12/21/22 0927 68     Resp 12/21/22 0927 16     Temp 12/21/22 0927 98.3 F (36.8 C)     Temp Source 12/21/22 0927 Oral     SpO2 12/21/22 0928 97 %     Weight --      Height --      Head Circumference --      Peak Flow --      Pain Score 12/21/22 0928 3     Pain Loc --      Pain Education --      Exclude from Growth Chart --    No data found.  Updated Vital Signs BP 129/86 (BP  Location: Right Arm)   Pulse 68   Temp 98.3 F (36.8 C) (Oral)   Resp 16   SpO2 97%       Physical Exam Constitutional:      General: He is not in acute distress.    Appearance: He is well-developed.  HENT:     Head: Normocephalic and atraumatic.  Eyes:     Conjunctiva/sclera: Conjunctivae normal.     Pupils: Pupils are equal, round, and reactive to light.  Cardiovascular:     Rate and Rhythm: Normal rate.  Pulmonary:     Effort: Pulmonary effort is normal. No respiratory distress.  Abdominal:     General: There is no distension.     Palpations: Abdomen is soft.  Musculoskeletal:        General: Normal range of motion.     Cervical back: Normal range of motion.       Feet:  Skin:    General: Skin is warm and dry.  Neurological:     Mental Status: He is alert.     Gait: Gait abnormal.      UC Treatments / Results  Labs (all labs ordered are listed, but only abnormal results are displayed) Labs Reviewed - No data to display  EKG   Radiology DG Foot Complete Left  Result Date: 12/21/2022 CLINICAL DATA:  Pain in metatarsals.  Pain worse when walking EXAM: LEFT FOOT -  COMPLETE 3+ VIEW COMPARISON:  None Available. FINDINGS: There is no evidence of fracture or dislocation. There is no evidence of arthropathy or other focal bone abnormality. Soft tissues are unremarkable. IMPRESSION: Negative. Electronically Signed   By: Larose Hires D.O.   On: 12/21/2022 10:16    Procedures Procedures (including critical care time)  Medications Ordered in UC Medications - No data to display  Initial Impression / Assessment and Plan / UC Course  I have reviewed the triage vital signs and the nursing notes.  Pertinent labs & imaging results that were available during my care of the patient were reviewed by me and considered in my medical decision making (see chart for details).     Final Clinical Impressions(s) / UC Diagnoses   Final diagnoses:  Acute foot pain, left  Sprain of foot, left, initial encounter     Discharge Instructions      X ray is normal Foot is sprained Take ibuprofen 3 x a day with food Use ice and elevation to help with pain Limit walking while foot is painful See your doctor if not improving by next week     ED Prescriptions     Medication Sig Dispense Auth. Provider   ibuprofen (ADVIL) 800 MG tablet Take 1 tablet (800 mg total) by mouth 3 (three) times daily. 21 tablet Eustace Moore, MD      PDMP not reviewed this encounter.   Eustace Moore, MD 12/21/22 1026

## 2022-12-21 NOTE — Discharge Instructions (Addendum)
X ray is normal Foot is sprained Take ibuprofen 3 x a day with food Use ice and elevation to help with pain Limit walking while foot is painful See your doctor if not improving by next week

## 2023-01-12 ENCOUNTER — Encounter: Payer: Self-pay | Admitting: Emergency Medicine

## 2023-01-12 ENCOUNTER — Ambulatory Visit
Admission: EM | Admit: 2023-01-12 | Discharge: 2023-01-12 | Disposition: A | Discharge status: Home / Self Care | Payer: BC Managed Care – PPO

## 2023-01-12 DIAGNOSIS — L551 Sunburn of second degree: Secondary | ICD-10-CM

## 2023-01-12 DIAGNOSIS — L550 Sunburn of first degree: Secondary | ICD-10-CM

## 2023-01-12 MED ORDER — IBUPROFEN 800 MG PO TABS: 800.0000 mg | ORAL_TABLET | Freq: Every day | ORAL | 0 refills | Status: AC | PRN

## 2023-01-12 MED ORDER — KETOROLAC TROMETHAMINE 60 MG/2ML IM SOLN
60.0000 mg | Freq: Once | INTRAMUSCULAR | Status: AC
  Administered 2023-01-12: 60 mg via INTRAMUSCULAR

## 2023-01-12 NOTE — ED Triage Notes (Signed)
Patient states that he was playing tennis on Saturday and did not apply enough sunscreen on his shoulders.  Now the areas are red, peeling and have blisters.  The areas are painful.

## 2023-01-12 NOTE — ED Provider Notes (Signed)
[facility]    CSN: [id number] Arrival date & time: 01/12/23  1247      History   Chief Complaint Chief Complaint  Patient presents with   Sunburn    HPI Trevor Bates is a 27 y.o. male.   HPI 27 year old male presents with sunburn of bilateral shoulders.  Reports was playing tennis this past Saturday, 01/07/2023 and did not apply an of sunscreen.  PMH significant for elevated blood pressure, anxiety state, and insomnia.  Past Medical History:  Diagnosis Date   No pertinent past medical history     Patient Active Problem List   Diagnosis Date Noted   Unspecified vitamin D deficiency 10/20/2013   Allergic rhinitis 09/08/2013   Insomnia 09/08/2013   Anxiety state 09/08/2013   Snores 09/08/2013   Elevated blood pressure 07/13/2011    Past Surgical History:  Procedure Laterality Date   NO PAST SURGERIES         Home Medications    Prior to Admission medications   Medication Sig Start Date End Date Taking? Authorizing Provider  finasteride (PROPECIA) 1 MG tablet Take 1 mg by mouth daily. 08/12/22  Yes [provider]  ibuprofen (ADVIL) 800 MG tablet Take 1 tablet (800 mg total) by mouth daily as needed for up to 10 days. 01/12/23 01/22/23 Yes Trevor Iha, FNP  fluticasone (FLONASE) 50 MCG/ACT nasal spray Place 2 sprays into both nostrils daily. 02/22/22   Sharlene Dory, DO  traZODone (DESYREL) 50 MG tablet Take 0.5-1 tablets (25-50 mg total) by mouth at bedtime as needed for sleep. 02/22/22   Sharlene Dory, DO    Family History Family History  Problem Relation Age of Onset   Hyperlipidemia Mother    Hyperlipidemia Father    Heart attack Maternal Grandmother    Breast cancer Maternal Grandmother    Thyroid disease Maternal Aunt    Sudden death Neg Hx    Colon cancer Neg Hx    Prostate cancer Neg Hx     Social History Social History   Tobacco Use   Smoking status: Never   Smokeless tobacco: Never  Vaping Use    Vaping status: Some Days  Substance Use Topics   Alcohol use: Yes   Drug use: Yes    Types: Marijuana     Allergies   Minocycline   Review of Systems Review of Systems  Skin:  Positive for rash.  All other systems reviewed and are negative.    Physical Exam Triage Vital Signs ED Triage Vitals  Encounter Vitals Group     BP 01/12/23 1301 132/79     Systolic BP Percentile --      Diastolic BP Percentile --      Pulse Rate 01/12/23 1301 61     Resp 01/12/23 1301 16     Temp 01/12/23 1301 97.7 F (36.5 C)     Temp Source 01/12/23 1301 Oral     SpO2 01/12/23 1301 97 %     Weight --      Height --      Head Circumference --      Peak Flow --      Pain Score 01/12/23 1303 7     Pain Loc --      Pain Education --      Exclude from Growth Chart --    No data found.  Updated Vital Signs BP 132/79 (BP Location: Right Arm)   Pulse 61   Temp 97.7 F (  36.5 C) (Oral)   Resp 16   Ht 5\' 7"  (1.702 m)   Wt 185 lb (83.9 kg)   SpO2 97%   BMI 28.98 kg/m    Physical Exam Vitals and nursing note reviewed.  Constitutional:      Appearance: Normal appearance. He is normal weight.  HENT:     Head: Normocephalic and atraumatic.     Mouth/Throat:     Mouth: Mucous membranes are moist.     Pharynx: Oropharynx is clear.  Eyes:     Extraocular Movements: Extraocular movements intact.     Conjunctiva/sclera: Conjunctivae normal.     Pupils: Pupils are equal, round, and reactive to light.  Cardiovascular:     Rate and Rhythm: Normal rate and regular rhythm.     Pulses: Normal pulses.     Heart sounds: Normal heart sounds.  Pulmonary:     Effort: Pulmonary effort is normal.     Breath sounds: Normal breath sounds. No wheezing, rhonchi or rales.  Musculoskeletal:        General: Normal range of motion.     Cervical back: Normal range of motion and neck supple.  Skin:    General: Skin is warm and dry.     Comments: Bilateral shoulders, erythematous blistering noted-please  see image below  Neurological:     General: No focal deficit present.     Mental Status: He is alert and oriented to person, place, and time. Mental status is at baseline.  Psychiatric:        Mood and Affect: Mood normal.        Behavior: Behavior normal.        Thought Content: Thought content normal.      UC Treatments / Results  Labs (all labs ordered are listed, but only abnormal results are displayed) Labs Reviewed - No data to display  EKG   Radiology No results found.  Procedures Procedures (including critical care time)  Medications Ordered in UC Medications  ketorolac (TORADOL) injection 60 mg (60 mg Intramuscular Given 01/12/23 1345)    Initial Impression / Assessment and Plan / UC Course  I have reviewed the triage vital signs and the nursing notes.  Pertinent labs & imaging results that were available during my care of the patient were reviewed by me and considered in my medical decision making (see chart for details).    MDM: 1.  Sunburn, blistering-IM Toradol 60 mg given once in clinic and prior to discharge. 2.  Sunburn of first-degree-Rx'd Ibuprofen 800 mg daily for the next 7 to 10 days. Advised patient to take Ibuprofen 800 mg once daily for the next 7 to 10 days.  Encouraged increase daily water intake to 64 ounces per day while taking this medication.  Advised patient to apply topically OTC aloe vera gel to affected areas of bilateral shoulders 4-6 times daily for the next 3 to 5 days.  Advised if symptoms worsen and/or unresolved please follow-up with PCP or here for further evaluation.  Patient discharged home, hemodynamically stable. Final Clinical Impressions(s) / UC Diagnoses   Final diagnoses:  Sunburn, blistering  Sunburn of first degree     Discharge Instructions      Advised patient to take Ibuprofen 800 mg once daily for the next 7 to 10 days.  Encouraged increase daily water intake to 64 ounces per day while taking this medication.   Advised patient to apply topically OTC aloe vera gel to affected areas of bilateral shoulders 4-6  times daily for the next 3 to 5 days.  Advised if symptoms worsen and/or unresolved please follow-up with PCP or here for further evaluation     ED Prescriptions     Medication Sig Dispense Auth. Provider   ibuprofen (ADVIL) 800 MG tablet Take 1 tablet (800 mg total) by mouth daily as needed for up to 10 days. 21 tablet Trevor Iha, FNP      PDMP not reviewed this encounter.   Trevor Iha, FNP 01/12/23 1352

## 2023-01-12 NOTE — Discharge Instructions (Addendum)
Advised patient to take Ibuprofen 800 mg once daily for the next 7 to 10 days.  Encouraged increase daily water intake to 64 ounces per day while taking this medication.  Advised patient to apply topically OTC aloe vera gel to affected areas of bilateral shoulders 4-6 times daily for the next 3 to 5 days.  Advised if symptoms worsen and/or unresolved please follow-up with PCP or here for further evaluation

## 2023-02-03 DIAGNOSIS — L700 Acne vulgaris: Secondary | ICD-10-CM | POA: Diagnosis not present

## 2023-02-03 DIAGNOSIS — L578 Other skin changes due to chronic exposure to nonionizing radiation: Secondary | ICD-10-CM | POA: Diagnosis not present

## 2023-02-03 DIAGNOSIS — L858 Other specified epidermal thickening: Secondary | ICD-10-CM | POA: Diagnosis not present

## 2023-02-03 DIAGNOSIS — L810 Postinflammatory hyperpigmentation: Secondary | ICD-10-CM | POA: Diagnosis not present

## 2023-02-24 ENCOUNTER — Ambulatory Visit (INDEPENDENT_AMBULATORY_CARE_PROVIDER_SITE_OTHER): Payer: BC Managed Care – PPO | Admitting: Psychology

## 2023-02-24 DIAGNOSIS — F411 Generalized anxiety disorder: Secondary | ICD-10-CM
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
## 2023-03-10 ENCOUNTER — Ambulatory Visit: Payer: BC Managed Care – PPO | Admitting: Psychology

## 2023-03-24 ENCOUNTER — Ambulatory Visit: Payer: BC Managed Care – PPO | Admitting: Psychology

## 2023-03-24 DIAGNOSIS — F411 Generalized anxiety disorder: Secondary | ICD-10-CM

## 2023-03-24 NOTE — Progress Notes (Signed)
[facility] Counselor/Therapist Progress Note  Patient ID: TANIS MALCZEWSKI, MRN: [id number]   Date: 03/24/23  Time Spent: 2:04  pm - 3:04 pm : 60 Minutes  Treatment Type: Individual Therapy.  Reported Symptoms: Anxiety, depression, OCD.   Mental Status Exam: Appearance:  Casual     Behavior: Appropriate  Motor: Normal  Speech/Language:  Clear and Coherent  Affect: Flat  Mood: dysthymic  Thought process: normal  Thought content:   WNL  Sensory/Perceptual disturbances:   WNL  Orientation: oriented to person, place, time/date, and situation  Attention: Good  Concentration: Good  Memory: WNL  Fund of knowledge:  Good  Insight:   Good  Judgment:  Good  Impulse Control: Good   Risk Assessment: Danger to Self:  No Self-injurious Behavior: No Danger to Others: No Duty to Warn:no Physical Aggression / Violence:No  Access to Firearms a concern: No  Gang Involvement:No   Patient / guardian was educated about steps to take if suicide or homicide risk level increases between visits: yes While future psychiatric events cannot be accurately predicted, the patient does not currently require acute inpatient psychiatric care and does not currently meet [facility] involuntary commitment criteria.   In case of a mental health emergency:   14 - confidential suicide hotline. [facility]):        [address], [place] [address]       [phone] 3.   911  4.   Visiting Nearest ED.   Subjective:   Wetzel Bjornstad participated from home, via video and consented to treatment. Therapist participated from home office. I discussed the limitations of evaluation and management by telemedicine and the availability of in person appointments. The patient expressed understanding and agreed to proceed. Cuthbert reviewed the events of the past week.   Leotis Shames noted some of his somatic symptoms, when anxious, including hand and feet  shaking, sweating palms and feet, stuttering, muscle tension.    We reviewed numerous treatment approaches including CBT, BA, Problem Solving, and Solution focused therapy. Psych-education regarding the Rasheen's diagnosis of Anxiety state was provided during the session. We discussed Lumumba Loertscher Coppa's goals treatment goals which include hypervigilance, manage overall symptoms, process past events, bolster coping skills, challenge negative self-talk, manage rumination, engage in consistent self-care, mindfulness, managing distress, reduce stressing,  learning more about anxiety and depression, engage in consistent self-care. Wetzel Bjornstad provided verbal approval of the treatment plan.  Jacquees was recommended for psychiatric treatment and we will discuss this going forward in our follow-up session. Lewellyn was provided, via email, the ASRS v1.1 and PCL-5.   Interventions: Psycho-education & Goal Setting.   Diagnosis:  Anxiety state  Psychiatric Treatment: No , but would benefit from a psychiatric consult.    Treatment Plan:  Client Abilities/Strengths Keilen is intelligent and motivated for change.   Support System: Outpatient Therapy.   Client Treatment Preferences OPT  Client Statement of Needs Rickard would like to reducing hypervigilance, manage overall symptoms, process past events, bolster coping skills, challenge negative self-talk, manage rumination, engage in consistent self-care, mindfulness, managing distress, reduce stressing,  learning more about anxiety and depression, engage in consistent self-care.   Treatment Level Weekly  Symptoms  Anxiety:  feeling anxious, difficulty managing worry, worrying too much about different things, trouble relaxing, restlessness, irritability, and feeling afraid something awful might happen. Anxiety in large crowds and fear of heights, anticipatory anxiety, OCD (4x check doors prior to travel or  leave home) (Status:  maintained)  Depression: loss of interest, improve self-esteem,     (Status: maintained)  Goals:   [place] experiences symptoms of anxiety and depression.   Treatment plan signed and available on s-drive:  No, pending signature.    Target Date: 03/23/24 Frequency: Weekly  Progress: 0 Modality: individual    Therapist will provide referrals for additional resources as appropriate.  Therapist will provide psycho-education regarding Broxton's diagnosis and corresponding treatment approaches and interventions. Licensed Clinical Social Worker, [place], LCSW will support the patient's ability to achieve the goals identified. will employ CBT, BA, Problem-solving, Solution Focused, Mindfulness,  coping skills, & other evidenced-based practices will be used to promote progress towards healthy functioning to help manage decrease symptoms associated with his diagnosis.   Reduce overall level, frequency, and intensity of the feelings of depression, anxiety and panic evidenced by decreased overall symptom from 6 to 7 days/week to 0 to 1 days/week per client report for at least 3 consecutive months. Verbally express understanding of the relationship between feelings of depression, anxiety and their impact on thinking patterns and behaviors. Verbalize an understanding of the role that distorted thinking plays in creating fears, excessive worry, and ruminations.   Tinnie Gens participated in the creation of the treatment plan)   Delight Ovens, LCSW

## 2023-04-07 ENCOUNTER — Ambulatory Visit: Payer: BC Managed Care – PPO | Admitting: Psychology

## 2023-04-07 DIAGNOSIS — F411 Generalized anxiety disorder: Secondary | ICD-10-CM

## 2023-04-07 NOTE — Progress Notes (Signed)
[facility] Counselor/Therapist Progress Note  Patient ID: Trevor Bates, MRN: [id number]   Date: 04/07/23  Time Spent: 2:05  pm - 2:55 pm : 50 Minutes  Treatment Type: Individual Therapy.  Reported Symptoms: Anxiety, depression, OCD.   Mental Status Exam: Appearance:  Casual     Behavior: Appropriate  Motor: Normal  Speech/Language:  Clear and Coherent  Affect: Flat  Mood: dysthymic  Thought process: normal  Thought content:   WNL  Sensory/Perceptual disturbances:   WNL  Orientation: oriented to person, place, time/date, and situation  Attention: Good  Concentration: Good  Memory: WNL  Fund of knowledge:  Good  Insight:   Good  Judgment:  Good  Impulse Control: Good   Risk Assessment: Danger to Self:  No Self-injurious Behavior: No Danger to Others: No Duty to Warn:no Physical Aggression / Violence:No  Access to Firearms a concern: No  Gang Involvement:No   Patient / guardian was educated about steps to take if suicide or homicide risk level increases between visits: yes While future psychiatric events cannot be accurately predicted, the patient does not currently require acute inpatient psychiatric care and does not currently meet [facility] involuntary commitment criteria.   In case of a mental health emergency:   61 - confidential suicide hotline. [facility]        [address], [place] [address]       [phone] 3.   911  4.   Visiting Nearest ED.   Subjective:   Trevor Bates participated from home, via video and consented to treatment. Therapist participated from home office. I discussed the limitations of evaluation and management by telemedicine and the availability of in person appointments. The patient expressed understanding and agreed to proceed. Trevor Bates reviewed the events of the past week. He noted working on re-framing, between sessions, and noted this being helpful this past  week. He noted his interest in meeting with a psychiatric provider. Resources were provided for providers, via email. He noted difficulty with procrastinating and noted this affecting him during work and home. He noted often getting "really bored" and "I end up doing another task". He often ends up using his phone and walking around. At home, while working, he often leaves the task, returns, and "forgets" about the task. We identified that Trevor Bates often avoids tasks and, at times, forgets to reinitiate the task. Trevor Bates discussed difficulty with getting and staying organized, difficulty building and maintaining habits. He noted "I am not very good with time-management at all" and noted difficulty with scheduling tasks consistently due to poor scheduling and forgetfulness. He noted often feeling very activated during arguments and noted often "becoming really cold and short with people". We discussed fair fighting rules and therapist modeled this during the session. We discussed our work, going forward, in being mindful of mood and stressors, learning distress tolerance skills, and working on managing symptoms during these times. Trevor Bates was engaged and motivated during the session. We discussed some orgianizational tools including reminders, identifying locations to store commonly lost items, and labeling locations. Trevor Bates was engaged and motivated during the session. He expressed commitment towards goals. He noted his commitment to complete the ASRSV1.1 prior to our follow-up. Therapist validated Trevor Bates's feelings and provided supportive therapy.    Interventions: CBT & Interpersonal.   Diagnosis:  Anxiety state  Psychiatric Treatment: No , but would benefit from a psychiatric consult.    Treatment Plan:  Client Abilities/Strengths Trevor Bates is  intelligent and motivated for change.   Support System: Outpatient Therapy.   Client Treatment Preferences OPT  Client Statement of Needs Trevor Bates would  like to reducing hypervigilance, manage overall symptoms, process past events, bolster coping skills, challenge negative self-talk, manage rumination, engage in consistent self-care, mindfulness, managing distress, reduce stressing,  learning more about anxiety and depression, engage in consistent self-care, manage fear of being alone, and reduce procrastination.    Treatment Level Weekly  Symptoms  Anxiety:  feeling anxious, difficulty managing worry, worrying too much about different things, trouble relaxing, restlessness, irritability, and feeling afraid something awful might happen. Anxiety in large crowds and fear of heights, anticipatory anxiety, OCD (4x check doors prior to travel or leave home) (Status: maintained)  Depression: loss of interest, improve self-esteem,     (Status: maintained)  Goals:   Trevor Bates experiences symptoms of anxiety and depression.   Treatment plan signed and available on s-drive:  No, pending signature.    Target Date: 03/23/24 Frequency: Weekly  Progress: 0 Modality: individual    Therapist will provide referrals for additional resources as appropriate.  Therapist will provide psycho-education regarding Trevor Bates's diagnosis and corresponding treatment approaches and interventions. Licensed Clinical Social Worker, [place], LCSW will support the patient's ability to achieve the goals identified. will employ CBT, BA, Problem-solving, Solution Focused, Mindfulness,  coping skills, & other evidenced-based practices will be used to promote progress towards healthy functioning to help manage decrease symptoms associated with his diagnosis.   Reduce overall level, frequency, and intensity of the feelings of depression, anxiety and panic evidenced by decreased overall symptom from 6 to 7 days/week to 0 to 1 days/week per client report for at least 3 consecutive months. Verbally express understanding of the relationship between feelings of depression, anxiety  and their impact on thinking patterns and behaviors. Verbalize an understanding of the role that distorted thinking plays in creating fears, excessive worry, and ruminations.   Trevor Bates participated in the creation of the treatment plan)   Delight Ovens, LCSW

## 2023-05-05 ENCOUNTER — Ambulatory Visit: Payer: BC Managed Care – PPO | Admitting: Psychology

## 2023-06-09 ENCOUNTER — Ambulatory Visit: Payer: BC Managed Care – PPO | Admitting: Psychology
